# Patient Record
Sex: Female | Born: 1965 | Race: White | Hispanic: No | Marital: Married | Smoking: Never smoker
Health system: Southern US, Community
[De-identification: ages and names within clinical notes are randomized; demographics above are authoritative.]

## PROBLEM LIST (undated history)

## (undated) DIAGNOSIS — R112 Nausea with vomiting, unspecified: Secondary | ICD-10-CM

## (undated) DIAGNOSIS — T4145XA Adverse effect of unspecified anesthetic, initial encounter: Secondary | ICD-10-CM

## (undated) DIAGNOSIS — Z9889 Other specified postprocedural states: Secondary | ICD-10-CM

## (undated) DIAGNOSIS — T8859XA Other complications of anesthesia, initial encounter: Secondary | ICD-10-CM

## (undated) DIAGNOSIS — H35349 Macular cyst, hole, or pseudohole, unspecified eye: Secondary | ICD-10-CM

## (undated) DIAGNOSIS — H409 Unspecified glaucoma: Secondary | ICD-10-CM

## (undated) DIAGNOSIS — I341 Nonrheumatic mitral (valve) prolapse: Secondary | ICD-10-CM

---

## 1999-04-09 ENCOUNTER — Other Ambulatory Visit: Admission: RE | Admit: 1999-04-09 | Discharge: 1999-04-09 | Payer: Self-pay | Admitting: Obstetrics and Gynecology

## 2000-06-05 ENCOUNTER — Other Ambulatory Visit: Admission: RE | Admit: 2000-06-05 | Discharge: 2000-06-05 | Payer: Self-pay | Admitting: Obstetrics and Gynecology

## 2001-06-09 ENCOUNTER — Other Ambulatory Visit: Admission: RE | Admit: 2001-06-09 | Discharge: 2001-06-09 | Payer: Self-pay | Admitting: Obstetrics and Gynecology

## 2001-09-30 HISTORY — PX: FINGER SURGERY: SHX640

## 2002-06-22 ENCOUNTER — Other Ambulatory Visit: Admission: RE | Admit: 2002-06-22 | Discharge: 2002-06-22 | Payer: Self-pay | Admitting: Obstetrics and Gynecology

## 2003-06-27 ENCOUNTER — Other Ambulatory Visit: Admission: RE | Admit: 2003-06-27 | Discharge: 2003-06-27 | Payer: Self-pay | Admitting: Obstetrics and Gynecology

## 2003-10-01 HISTORY — PX: EYE SURGERY: SHX253

## 2003-11-25 ENCOUNTER — Ambulatory Visit (HOSPITAL_COMMUNITY): Admission: AD | Admit: 2003-11-25 | Discharge: 2003-11-26 | Payer: Self-pay | Admitting: Ophthalmology

## 2004-09-30 HISTORY — PX: VARICOSE VEIN SURGERY: SHX832

## 2004-11-29 ENCOUNTER — Ambulatory Visit (HOSPITAL_COMMUNITY): Admission: RE | Admit: 2004-11-29 | Discharge: 2004-11-29 | Payer: Self-pay | Admitting: Obstetrics and Gynecology

## 2005-12-17 ENCOUNTER — Ambulatory Visit (HOSPITAL_COMMUNITY): Admission: RE | Admit: 2005-12-17 | Discharge: 2005-12-17 | Payer: Self-pay | Admitting: Obstetrics and Gynecology

## 2006-12-19 ENCOUNTER — Ambulatory Visit (HOSPITAL_COMMUNITY): Admission: RE | Admit: 2006-12-19 | Discharge: 2006-12-19 | Payer: Self-pay | Admitting: Obstetrics and Gynecology

## 2007-01-06 ENCOUNTER — Encounter: Admission: RE | Admit: 2007-01-06 | Discharge: 2007-01-06 | Payer: Self-pay | Admitting: Obstetrics and Gynecology

## 2007-12-28 ENCOUNTER — Ambulatory Visit (HOSPITAL_COMMUNITY): Admission: RE | Admit: 2007-12-28 | Discharge: 2007-12-28 | Payer: Self-pay | Admitting: Obstetrics and Gynecology

## 2008-08-10 ENCOUNTER — Ambulatory Visit: Payer: Self-pay | Admitting: Vascular Surgery

## 2008-10-28 ENCOUNTER — Other Ambulatory Visit: Admission: RE | Admit: 2008-10-28 | Discharge: 2008-10-28 | Payer: Self-pay | Admitting: Family Medicine

## 2008-11-11 ENCOUNTER — Ambulatory Visit: Payer: Self-pay | Admitting: Vascular Surgery

## 2008-12-14 ENCOUNTER — Ambulatory Visit: Payer: Self-pay | Admitting: Vascular Surgery

## 2008-12-29 ENCOUNTER — Ambulatory Visit (HOSPITAL_COMMUNITY): Admission: RE | Admit: 2008-12-29 | Discharge: 2008-12-29 | Payer: Self-pay | Admitting: Family Medicine

## 2009-01-18 ENCOUNTER — Ambulatory Visit: Payer: Self-pay | Admitting: Vascular Surgery

## 2009-01-25 ENCOUNTER — Ambulatory Visit: Payer: Self-pay | Admitting: Vascular Surgery

## 2009-03-08 ENCOUNTER — Ambulatory Visit: Payer: Self-pay | Admitting: Vascular Surgery

## 2009-09-20 ENCOUNTER — Ambulatory Visit: Payer: Self-pay | Admitting: Vascular Surgery

## 2010-01-01 ENCOUNTER — Ambulatory Visit (HOSPITAL_COMMUNITY): Admission: RE | Admit: 2010-01-01 | Discharge: 2010-01-01 | Payer: Self-pay | Admitting: Family Medicine

## 2010-10-21 ENCOUNTER — Encounter: Payer: Self-pay | Admitting: Obstetrics and Gynecology

## 2010-12-03 ENCOUNTER — Other Ambulatory Visit (HOSPITAL_COMMUNITY): Payer: Self-pay | Admitting: Family Medicine

## 2010-12-03 DIAGNOSIS — Z1231 Encounter for screening mammogram for malignant neoplasm of breast: Secondary | ICD-10-CM

## 2011-01-07 ENCOUNTER — Ambulatory Visit (HOSPITAL_COMMUNITY)
Admission: RE | Admit: 2011-01-07 | Discharge: 2011-01-07 | Disposition: A | Discharge status: Home / Self Care | Payer: Managed Care, Other (non HMO) | Source: Ambulatory Visit | Attending: Family Medicine | Admitting: Family Medicine

## 2011-01-07 DIAGNOSIS — Z1231 Encounter for screening mammogram for malignant neoplasm of breast: Secondary | ICD-10-CM | POA: Insufficient documentation

## 2011-02-12 NOTE — Assessment & Plan Note (Signed)
OFFICE VISIT   Michelle Zamora, Michelle Zamora  DOB:  1966-09-18                                       03/08/2009  NWGNF#:[id number]   Patient presents today for followup of her bilateral vein treatment.  She had stab phlebectomies of her extensive varicosities over her left  anterior thigh and laser ablation and stab phlebectomies of her right  leg, greater saphenous vein and tributaries.  She is quite pleased with  her results.  She reports that she has had complete resolution of the  pain and discomfort and her calves with prolonged standing and with her  menstrual period.  She was having good results with continued healing of  the areas of stab phlebectomy and resolution of her bruising.   She is quite pleased with her result, as am I.  She will see [place] again on  an as-needed basis.   Larina Earthly, M.D.  Electronically Signed   TFE/MEDQ  D:  03/08/2009  T:  03/08/2009  Job:  [id number]   cc:   [name], M.D.

## 2011-02-12 NOTE — Assessment & Plan Note (Signed)
OFFICE VISIT   Campion, Lerin A  DOB:  1965/12/13                                       01/25/2009  ZOXWR#:[id number]   Patient presents today for a 1-week followup of her right anterior  branch of her saphenous vein ablation and stab phlebectomy in multiple  tributaries through her thigh.  She has the usual amount of bruising and  mild soreness.   She underwent a duplex today, and this shows ablation of the anterior  branch of her saphenous vein.  The more medial, proper saphenous vein  itself remains patent and competent.   I am pleased with her initial result, as is the patient.  Plan to see  her again in 6 weeks for final followup.   Larina Earthly, M.D.  Electronically Signed   TFE/MEDQ  D:  01/25/2009  T:  01/26/2009  Job:  [id number]   cc:   Dr. Carlyon Shadow

## 2011-02-12 NOTE — Assessment & Plan Note (Signed)
OFFICE VISIT   Zamora, Michelle A  DOB:  02-Sep-1966                                       11/11/2008  ZOXWR#:[id number]   Patient presents today for followup of her bilateral venous  hypertension.  I had seen her initially for consultation on 08/10/08.  Patient is an otherwise healthy 45 year old with progressive changes of  venous hypertension bilaterally.  She has been very compliant with her  compression garments but reports that she has had no improvement.  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  She has had decreased frequency  in duration of her exercise program secondary to pain and swelling and  also reports this makes housework and yard work difficult with squatting  position and prolonged standing due to the pain and swelling as well.   I reviewed her duplex with her.  She does have a very large varix  extending from her left groin over her anterior thigh to her lateral  knee and down onto her calf.  Duplex imaging of this area reveals that  this does arise from the saphenofemoral junction and does not arise from  the saphenous vein itself.  I have recommended that she be treated with  stab phlebectomy on the left leg.   On the right leg, she has varicosities in her calf and also her medial  thigh, and these do arise from a refluxing greater saphenous vein.  I  have recommended that she have right leg laser ablation and stab  phlebectomy.  She reports that the left leg is bothering her more than  the right, so we would recommend staged procedure, dressing the left leg  first.  I discussed the procedure under local anesthesia in our office  with an approximately 1-1/2 hours duration each.  I did explain  potential complications with her as well.  She understands, and we will  proceed with the procedure  at her convenience in the next several weeks.   Larina Earthly, M.D.  Electronically Signed   TFE/MEDQ  D:  11/11/2008  T:  11/14/2008  Job:  [id number]   cc:   [name], M.D.

## 2011-02-12 NOTE — Procedures (Signed)
LOWER EXTREMITY VENOUS REFLUX EXAM   INDICATION:  Bilateral lower extremity varicose veins.   EXAM:  Using color-flow imaging and pulse Doppler spectral analysis, the  right and left common femoral, superficial femoral, popliteal, posterior  tibial, greater and lesser saphenous veins are evaluated.  There is no  evidence suggesting deep venous insufficiency in the right or left lower  extremity.   The right and left saphenofemoral junctions are not competent.  The  right and left GSV's are not competent with the caliber as described  below.   The right and left proximal short saphenous veins demonstrate  competency.   GSV Diameter (used if found to be incompetent only)                                            Right    Left  Proximal Greater Saphenous Vein           0.5 cm   0.5 cm  Proximal-to-mid-thigh                     0.3 cm   0.4 cm  Mid thigh                                 0.3 cm   0.4 cm  Mid-distal thigh                          0.4 cm   0.4 cm  Distal thigh                              0.6 cm   0.2 cm  Knee                                      0.5 cm   0.2 cm   IMPRESSION:  1. Right greater saphenous vein reflux is identified with the caliber      ranging from 0.3 cm to 0.6 cm from the knee to the groin.  On the      left, greater saphenous vein diameter measurements range from 0.2      cm to 0.5 cm from the knee to the groin.  2. The right and left greater saphenous veins are not aneurysmal.  3. The left greater saphenous vein is tortuous.  4. The deep venous system is competent.  5. The right and left lesser saphenous veins are competent.  6. The right anterior branch of the greater saphenous vein is      incompetent.  This branch bifurcates and communicates with the      medial branch of the mid thigh.  The remaining greater saphenous      vein is incompetent.  7. Although the left greater saphenous vein is incompetent, the      largest  varicosities originate at the saphenofemoral junction from      a common origin with the greater saphenous vein.      ___________________________________________  Larina Earthly, M.D.   MC/MEDQ  D:  08/10/2008  T:  08/10/2008  Job:  [id number]

## 2011-02-12 NOTE — Procedures (Signed)
DUPLEX DEEP VENOUS EXAM - LOWER EXTREMITY   INDICATION:  Followup evaluation status post right greater saphenous  vein laser ablation.   HISTORY:  Edema:  No.  Trauma/Surgery:  Right greater saphenous vein laser ablation on  01/18/2009.  Left varicose vein treatment by stab phlebectomy 03/17.  Pain:  Right leg pain.  PE:  No.  Previous DVT:  No.  Anticoagulants:  No.  Other:   DUPLEX EXAM:                CFV   SFV   PopV  PTV    GSV                R  L  R  L  R  L  R   L  R  L  Thrombosis    0  0  0     0     0      P  0  Spontaneous   +  +  +     +     +      +  +  Phasic        +  +  +     +     +      +  +  Augmentation  +  +  +     +     +      +  +  Compressible  +  +  +     +     +      P  +  Competent     +  +  +     +     +      P  +   Legend:  + - yes  o - no  p - partial  D - decreased   IMPRESSION:  1. No evidence of right leg DVT.  2. One of two proximal thigh branches of the right greater saphenous      vein is thrombosed.  The remaining right greater saphenous vein is      patent and competent to the level of the top of the patella where      an anterior branch to the lateral side of the leg allows reflux      with calf augmentation and release.  3. On the left the previously documented varicose veins originating      from the saphenofemoral junction appear to be thrombosed.    _____________________________  Larina Earthly, M.D.   MC/MEDQ  D:  01/25/2009  T:  01/25/2009  Job:  [id number]

## 2011-02-12 NOTE — Assessment & Plan Note (Signed)
OFFICE VISIT   Zamora, Michelle A  DOB:  02-19-1966                                       12/14/2008  BJYNW#:[id number]   Patient presents today for treatment of large extensive varicosities  throughout her anterior left thigh, left lateral knee and calf.   She had a prior duplex evaluation showing these arose from the  saphenofemoral junction with no evidence of incompetence of her  saphenous vein.   Under tumescent anesthesia, she had multiple stab phlebectomies with no  immediate complication.   She was instructed on postoperative care, and I will see her in several  weeks for followup.   Larina Earthly, M.D.  Electronically Signed   TFE/MEDQ  D:  12/14/2008  T:  12/15/2008  Job:  [id number]

## 2011-02-12 NOTE — Consult Note (Signed)
NEW PATIENT CONSULTATION   Zamora, Michelle A  DOB:  October 27, 1965                                       08/10/2008  CHART#:[id number]   The patient presents today for evaluation of progressive pain related to  venous varicosities.  She is a healthy pleasant 45 year old white female  with progressively severe venous varicosities, left leg greater than her  right.  She reports these have been present for a number of years and is  most painful around the time of her menses.  She works as a Runner, broadcasting/film/video and  has pain associated with prolonged standing with these.  She has pain  specifically over the varicosities in both legs and also swelling and  pain with prolonged standing over her distal ankles.  Her history is  otherwise unremarkable other than mitral valve prolapse.  She has no  history of hypertension, diabetes or cardiac disease.   FAMILY HISTORY:  Significant for myocardial infarction in her mother at  age 9.   SOCIAL HISTORY:  She is married with 2 children.  She works as a  Runner, broadcasting/film/video.  She does not smoke or drink alcohol.   REVIEW OF SYSTEMS:  Her weight is reported at 155 pounds.  She is 5 feet  8 inches tall.  Review of systems otherwise completely negative from a  pulmonary, GI, GU or vascular standpoint.   PHYSICAL EXAMINATION:  General:  A well-developed, well-nourished white  female appearing her stated age of 10.  Vital signs:  Blood pressure  98/62, heart rate is 71.  Her dorsalis pedis pulses are 2+ bilaterally.  Her left leg is noted for a very large varix extending from her groin  over her anterior thigh to her lateral knee and down on to her lateral  calf.  She also has tributary varicosities on her medial calf and at her  distal ankle.  On her left leg she also has varicosities throughout her,  to a lesser degree of her anterior thigh and also on her medial thigh  and calf.   She underwent a formal venous duplex in our office and this reveals  some  incompetence in an anterior branch on the right saphenous vein from her  calf where she has the varicosities up to the thigh.  On the left leg  her main issue is large tributary varix extending from the  saphenofemoral junction laterally over her thigh.  The saphenous vein  itself has minimal disease on the left.   I discussed the significance of this with the patient.  I have  recommended that we begin initially with graduated compression garments.  She had one of these at the time of her pregnancy.  She also takes  ibuprofen for discomfort and elevates her legs when possible.  We will  see her again in 3 months for continued followup.  I did discuss  potential treatment with stab phlebectomy at the left leg varicosities  and the potential laser ablation of her anterior saphenous vein on the  right leg for relief of symptoms.  We will see her back in 3 months for  further discussion and I have fitted her today with thigh high 20-30  mmHg compression garments.   Larina Earthly, M.D.  Electronically Signed   TFE/MEDQ  D:  08/10/2008  T:  08/11/2008  Job:  [id number]  cc:   Cordelia Pen A. Rosalio Macadamia, M.D.  [name], M.D.

## 2011-02-15 NOTE — Op Note (Signed)
NAME:  SERRIA, SLOMA                     ACCOUNT NO.:  [id number]   MEDICAL RECORD NO.:  [id number]                   PATIENT TYPE:  OIB   LOCATION:  2860                                 FACILITY:  [facility]   PHYSICIAN:  Beulah Gandy. Ashley Royalty, M.D.              DATE OF BIRTH:  [birth year]   DATE OF PROCEDURE:  11/25/2003  DATE OF DISCHARGE:                                 OPERATIVE REPORT   ADMISSION DIAGNOSIS:  Rhegmatogenous retinal detachment, left eye.   PROCEDURE:  Scleral buckle, left eye.  Retinal photocoagulation, left eye.   SURGEON:  Beulah Gandy. Ashley Royalty, M.D.   ASSISTANT:  Merian Capron, M.A.   ANESTHESIA:  General.   DETAILS:  Usual prep and drape, 360-degree limbal peritomy, isolation of  four rectus muscles on 2-0 silk, localization of break at 9 o'clock, scleral  dissection for 360 degrees.  The bed was wider on the upper nasal and  narrower on the upper and lower temporal sides.  The 279 implant was placed  around the globe.  Diathermy was placed in the bed.  A 240 band was placed  around the eye with a 270 sleeve at 7 o'clock.  Perforation site chosen in  the anterior aspect of the bed at 10 o'clock.  A moderate amount of clear,  colorless subretinal fluid came forth.  A 508-G radial segment was placed  beneath the horseshoe tear.  Indirect ophthalmoscopy showed the retina to be  lying nicely in place with the break well-supported on the scleral buckle.  The indirect ophthalmoscope laser was moved into place.  613 burns were  placed around the retinal periphery and around the retinal break.  The power  was 600 mW, 1000 microns each, and 0.1 seconds each. The buckle was adjusted  and trimmed.  The band was adjusted and trimmed.  The sutures were knotted  and the free ends removed.  Eight total sutures were placed.  The  conjunctiva was reposited with 7.0 chromic suture.  Paracentesis x1 resulted  in a closing tension of 10 with a Barraquer tonometer.  Polymyxin and  Gentamicin were irrigated into Tenon's space.  Atropine solution was  applied.  Decadron 10 mg was injected into the lower subconjunctival space.  Marcaine was injected around the globe for postop pain.  Closing tension was  10 with a Barraquer tonometer.   COMPLICATIONS:  None.   DURATION:  Two hours.   Polysporin, a patch, and shield were placed.  The patient was awakened and  taken to recovery in a satisfactory condition.                                               Beulah Gandy. Ashley Royalty, M.D.    JDM/MEDQ  D:  11/25/2003  T:  11/26/2003  Job:  [id number]

## 2011-11-20 ENCOUNTER — Ambulatory Visit (INDEPENDENT_AMBULATORY_CARE_PROVIDER_SITE_OTHER): Payer: Managed Care, Other (non HMO) | Admitting: Ophthalmology

## 2011-11-20 DIAGNOSIS — H33009 Unspecified retinal detachment with retinal break, unspecified eye: Secondary | ICD-10-CM

## 2011-11-20 DIAGNOSIS — H25049 Posterior subcapsular polar age-related cataract, unspecified eye: Secondary | ICD-10-CM

## 2011-11-20 DIAGNOSIS — H33309 Unspecified retinal break, unspecified eye: Secondary | ICD-10-CM

## 2011-11-20 DIAGNOSIS — H43819 Vitreous degeneration, unspecified eye: Secondary | ICD-10-CM

## 2011-12-02 ENCOUNTER — Other Ambulatory Visit (HOSPITAL_COMMUNITY): Payer: Self-pay | Admitting: Family Medicine

## 2011-12-02 DIAGNOSIS — Z1231 Encounter for screening mammogram for malignant neoplasm of breast: Secondary | ICD-10-CM

## 2012-01-17 ENCOUNTER — Ambulatory Visit (HOSPITAL_COMMUNITY)
Admission: RE | Admit: 2012-01-17 | Discharge: 2012-01-17 | Disposition: A | Discharge status: Home / Self Care | Payer: Managed Care, Other (non HMO) | Source: Ambulatory Visit | Attending: Family Medicine | Admitting: Family Medicine

## 2012-01-17 DIAGNOSIS — Z1231 Encounter for screening mammogram for malignant neoplasm of breast: Secondary | ICD-10-CM

## 2012-11-06 ENCOUNTER — Encounter (INDEPENDENT_AMBULATORY_CARE_PROVIDER_SITE_OTHER): Payer: Managed Care, Other (non HMO) | Admitting: Ophthalmology

## 2012-11-06 DIAGNOSIS — H2510 Age-related nuclear cataract, unspecified eye: Secondary | ICD-10-CM

## 2012-11-06 DIAGNOSIS — H35349 Macular cyst, hole, or pseudohole, unspecified eye: Secondary | ICD-10-CM

## 2012-11-06 DIAGNOSIS — H33009 Unspecified retinal detachment with retinal break, unspecified eye: Secondary | ICD-10-CM

## 2012-11-06 DIAGNOSIS — H33309 Unspecified retinal break, unspecified eye: Secondary | ICD-10-CM

## 2012-11-06 DIAGNOSIS — H43819 Vitreous degeneration, unspecified eye: Secondary | ICD-10-CM

## 2012-11-09 NOTE — H&P (Signed)
Michelle Zamora is an 47 y.o. female.   Chief Complaint:rapid loss of vision left eye HPI: hx of retinal detachment repair  Now has macular hole  No past medical history on file.  No past surgical history on file.  No family history on file. Social History:  has no tobacco, alcohol, and drug history on file.  Allergies: Allergies not on file  No prescriptions prior to admission    Review of systems otherwise negative  There were no vitals taken for this visit.  Physical exam: Mental status: oriented x3. Eyes: See eye exam associated with this date of surgery in media tab.  Scanned in by scanning center Ears, Nose, Throat: within normal limits Neck: Within Normal limits General: within normal limits Chest: Within normal limits Breast: deferred Heart: Within normal limits Abdomen: Within normal limits GU: deferred Extremities: within normal limits Skin: within normal limits  Assessment/Plan Macular hole left eye Plan: To [facility] for Pars plana vitrectomy, membrane peel, serum patch, laser treatment, gas injection.  Sherrie George 11/09/2012, 4:28 PM

## 2012-11-11 ENCOUNTER — Encounter (HOSPITAL_COMMUNITY): Payer: Self-pay | Admitting: Pharmacy Technician

## 2012-11-23 ENCOUNTER — Ambulatory Visit (INDEPENDENT_AMBULATORY_CARE_PROVIDER_SITE_OTHER): Payer: Managed Care, Other (non HMO) | Admitting: Ophthalmology

## 2012-11-23 ENCOUNTER — Encounter (HOSPITAL_COMMUNITY): Payer: Self-pay | Admitting: *Deleted

## 2012-11-23 MED ORDER — CEFAZOLIN SODIUM-DEXTROSE 2-3 GM-% IV SOLR
2.0000 g | INTRAVENOUS | Status: AC
  Administered 2012-11-24: 2 g via INTRAVENOUS
  Filled 2012-11-23: qty 50

## 2012-11-24 ENCOUNTER — Ambulatory Visit (HOSPITAL_COMMUNITY): Payer: Managed Care, Other (non HMO) | Admitting: Certified Registered Nurse Anesthetist

## 2012-11-24 ENCOUNTER — Encounter (HOSPITAL_COMMUNITY): Payer: Self-pay | Admitting: Certified Registered Nurse Anesthetist

## 2012-11-24 ENCOUNTER — Ambulatory Visit (HOSPITAL_COMMUNITY)
Admission: RE | Admit: 2012-11-24 | Discharge: 2012-11-25 | Disposition: A | Discharge status: Home / Self Care | Payer: Managed Care, Other (non HMO) | Source: Ambulatory Visit | Attending: Ophthalmology | Admitting: Ophthalmology

## 2012-11-24 ENCOUNTER — Ambulatory Visit (HOSPITAL_COMMUNITY): Payer: Managed Care, Other (non HMO)

## 2012-11-24 ENCOUNTER — Encounter (HOSPITAL_COMMUNITY)
Admission: RE | Disposition: A | Discharge status: Home / Self Care | Payer: Self-pay | Source: Ambulatory Visit | Attending: Ophthalmology

## 2012-11-24 DIAGNOSIS — H35342 Macular cyst, hole, or pseudohole, left eye: Secondary | ICD-10-CM

## 2012-11-24 DIAGNOSIS — Z01818 Encounter for other preprocedural examination: Secondary | ICD-10-CM | POA: Insufficient documentation

## 2012-11-24 DIAGNOSIS — H35349 Macular cyst, hole, or pseudohole, unspecified eye: Secondary | ICD-10-CM

## 2012-11-24 DIAGNOSIS — Z01812 Encounter for preprocedural laboratory examination: Secondary | ICD-10-CM | POA: Insufficient documentation

## 2012-11-24 HISTORY — PX: SERUM PATCH: SHX6091

## 2012-11-24 HISTORY — PX: OTHER SURGICAL HISTORY: SHX169

## 2012-11-24 HISTORY — DX: Nonrheumatic mitral (valve) prolapse: I34.1

## 2012-11-24 HISTORY — DX: Other complications of anesthesia, initial encounter: T88.59XA

## 2012-11-24 HISTORY — PX: 25 GAUGE PARS PLANA VITRECTOMY WITH 20 GAUGE MVR PORT FOR MACULAR HOLE: SHX6096

## 2012-11-24 HISTORY — DX: Nausea with vomiting, unspecified: R11.2

## 2012-11-24 HISTORY — PX: MEMBRANE PEEL: SHX5967

## 2012-11-24 HISTORY — DX: Macular cyst, hole, or pseudohole, unspecified eye: H35.349

## 2012-11-24 HISTORY — PX: GAS INSERTION: SHX5336

## 2012-11-24 HISTORY — DX: Adverse effect of unspecified anesthetic, initial encounter: T41.45XA

## 2012-11-24 HISTORY — PX: PHOTOCOAGULATION WITH LASER: SHX6027

## 2012-11-24 HISTORY — DX: Other specified postprocedural states: Z98.890

## 2012-11-24 LAB — SURGICAL PCR SCREEN
MRSA, PCR: NEGATIVE
Staphylococcus aureus: NEGATIVE

## 2012-11-24 LAB — CBC
HCT: 40.9 % (ref 36.0–46.0)
Hemoglobin: 14.8 g/dL (ref 12.0–15.0)
MCH: 33.3 pg (ref 26.0–34.0)
MCHC: 36.2 g/dL — ABNORMAL HIGH (ref 30.0–36.0)
MCV: 92.1 fL (ref 78.0–100.0)
Platelets: 168 10*3/uL (ref 150–400)
RBC: 4.44 MIL/uL (ref 3.87–5.11)
RDW: 12.1 % (ref 11.5–15.5)
WBC: 5.1 10*3/uL (ref 4.0–10.5)

## 2012-11-24 LAB — HCG, SERUM, QUALITATIVE: Preg, Serum: NEGATIVE

## 2012-11-24 LAB — AUTOLOGOUS SERUM PATCH PREP

## 2012-11-24 SURGERY — 25 GAUGE PARS PLANA VITRECTOMY WITH 20 GAUGE MVR PORT FOR MACULAR HOLE
Anesthesia: General | Site: Eye | Laterality: Left | Wound class: Clean

## 2012-11-24 MED ORDER — MAGNESIUM HYDROXIDE 400 MG/5ML PO SUSP: 15.0000 mL | Freq: Four times a day (QID) | ORAL | Status: DC | PRN

## 2012-11-24 MED ORDER — BACITRACIN-POLYMYXIN B 500-10000 UNIT/GM OP OINT
TOPICAL_OINTMENT | OPHTHALMIC | Status: DC | PRN
  Administered 2012-11-24: 1 via OPHTHALMIC

## 2012-11-24 MED ORDER — OXYCODONE HCL 5 MG PO TABS: 5.0000 mg | ORAL_TABLET | Freq: Once | ORAL | Status: DC | PRN

## 2012-11-24 MED ORDER — OXYCODONE HCL 5 MG/5ML PO SOLN: 5.0000 mg | Freq: Once | ORAL | Status: DC | PRN

## 2012-11-24 MED ORDER — ONDANSETRON HCL 4 MG/2ML IJ SOLN: 4.0000 mg | Freq: Four times a day (QID) | INTRAMUSCULAR | Status: DC | PRN

## 2012-11-24 MED ORDER — TEMAZEPAM 15 MG PO CAPS: 15.0000 mg | ORAL_CAPSULE | Freq: Every evening | ORAL | Status: DC | PRN

## 2012-11-24 MED ORDER — MIDAZOLAM HCL 5 MG/5ML IJ SOLN
INTRAMUSCULAR | Status: DC | PRN
  Administered 2012-11-24: 2 mg via INTRAVENOUS

## 2012-11-24 MED ORDER — TROPICAMIDE 1 % OP SOLN
1.0000 [drp] | OPHTHALMIC | Status: AC | PRN
  Administered 2012-11-24: 1 [drp] via OPHTHALMIC
  Filled 2012-11-24: qty 3

## 2012-11-24 MED ORDER — FENTANYL CITRATE 0.05 MG/ML IJ SOLN
25.0000 ug | INTRAMUSCULAR | Status: DC | PRN
  Administered 2012-11-24: 50 ug via INTRAVENOUS

## 2012-11-24 MED ORDER — MORPHINE SULFATE 2 MG/ML IJ SOLN: 1.0000 mg | INTRAMUSCULAR | Status: DC | PRN

## 2012-11-24 MED ORDER — SCOPOLAMINE 1 MG/3DAYS TD PT72: 1.0000 | MEDICATED_PATCH | TRANSDERMAL | Status: DC

## 2012-11-24 MED ORDER — PROMETHAZINE HCL 25 MG/ML IJ SOLN: 6.2500 mg | INTRAMUSCULAR | Status: DC | PRN

## 2012-11-24 MED ORDER — SCOPOLAMINE 1 MG/3DAYS TD PT72
MEDICATED_PATCH | TRANSDERMAL | Status: AC
  Administered 2012-11-24: 1.5 mg via TRANSDERMAL
  Filled 2012-11-24: qty 1

## 2012-11-24 MED ORDER — DOCUSATE SODIUM 100 MG PO CAPS
100.0000 mg | ORAL_CAPSULE | Freq: Two times a day (BID) | ORAL | Status: DC
  Administered 2012-11-24: 100 mg via ORAL
  Filled 2012-11-24: qty 1

## 2012-11-24 MED ORDER — HEMOSTATIC AGENTS (NO CHARGE) OPTIME
TOPICAL | Status: DC | PRN
  Administered 2012-11-24: 1

## 2012-11-24 MED ORDER — SODIUM CHLORIDE 0.9 % IV SOLN
INTRAVENOUS | Status: DC
  Administered 2012-11-24: 14:00:00 via INTRAVENOUS

## 2012-11-24 MED ORDER — CYCLOPENTOLATE HCL 1 % OP SOLN
1.0000 [drp] | OPHTHALMIC | Status: AC | PRN
  Administered 2012-11-24: 1 [drp] via OPHTHALMIC
  Filled 2012-11-24: qty 2

## 2012-11-24 MED ORDER — BSS IO SOLN
INTRAOCULAR | Status: AC
  Filled 2012-11-24: qty 15

## 2012-11-24 MED ORDER — SODIUM HYALURONATE 10 MG/ML IO SOLN
INTRAOCULAR | Status: AC
  Filled 2012-11-24: qty 0.85

## 2012-11-24 MED ORDER — POLYMYXIN B SULFATE 500000 UNITS IJ SOLR
INTRAMUSCULAR | Status: AC
  Filled 2012-11-24: qty 1

## 2012-11-24 MED ORDER — FENTANYL CITRATE 0.05 MG/ML IJ SOLN
INTRAMUSCULAR | Status: AC
  Filled 2012-11-24: qty 2

## 2012-11-24 MED ORDER — DEXAMETHASONE SODIUM PHOSPHATE 10 MG/ML IJ SOLN
INTRAMUSCULAR | Status: DC | PRN
  Administered 2012-11-24: 10 mg

## 2012-11-24 MED ORDER — BSS IO SOLN
INTRAOCULAR | Status: DC | PRN
  Administered 2012-11-24: 15 mL via INTRAOCULAR

## 2012-11-24 MED ORDER — EPHEDRINE SULFATE 50 MG/ML IJ SOLN
INTRAMUSCULAR | Status: DC | PRN
  Administered 2012-11-24: 5 mg via INTRAVENOUS

## 2012-11-24 MED ORDER — MIDAZOLAM HCL 2 MG/2ML IJ SOLN: 0.5000 mg | Freq: Once | INTRAMUSCULAR | Status: DC | PRN

## 2012-11-24 MED ORDER — ATROPINE SULFATE 1 % OP SOLN
OPHTHALMIC | Status: AC
  Filled 2012-11-24: qty 2

## 2012-11-24 MED ORDER — BUPIVACAINE HCL (PF) 0.75 % IJ SOLN
INTRAMUSCULAR | Status: AC
  Filled 2012-11-24: qty 10

## 2012-11-24 MED ORDER — BUPIVACAINE HCL (PF) 0.75 % IJ SOLN
INTRAMUSCULAR | Status: DC | PRN
  Administered 2012-11-24: 10 mL

## 2012-11-24 MED ORDER — PROVISC 10 MG/ML IO SOLN
INTRAOCULAR | Status: DC | PRN
  Administered 2012-11-24: .85 mL via INTRAOCULAR

## 2012-11-24 MED ORDER — BACITRACIN-POLYMYXIN B 500-10000 UNIT/GM OP OINT
TOPICAL_OINTMENT | OPHTHALMIC | Status: AC
  Filled 2012-11-24: qty 3.5

## 2012-11-24 MED ORDER — HYALURONIDASE HUMAN 150 UNIT/ML IJ SOLN
INTRAMUSCULAR | Status: AC
  Filled 2012-11-24: qty 1

## 2012-11-24 MED ORDER — PREDNISOLONE ACETATE 1 % OP SUSP
1.0000 [drp] | Freq: Four times a day (QID) | OPHTHALMIC | Status: DC
  Filled 2012-11-24: qty 1

## 2012-11-24 MED ORDER — ROCURONIUM BROMIDE 100 MG/10ML IV SOLN
INTRAVENOUS | Status: DC | PRN
  Administered 2012-11-24: 40 mg via INTRAVENOUS

## 2012-11-24 MED ORDER — DEXAMETHASONE SODIUM PHOSPHATE 10 MG/ML IJ SOLN
INTRAMUSCULAR | Status: AC
  Filled 2012-11-24: qty 1

## 2012-11-24 MED ORDER — GLYCOPYRROLATE 0.2 MG/ML IJ SOLN
INTRAMUSCULAR | Status: DC | PRN
  Administered 2012-11-24: 0.6 mg via INTRAVENOUS
  Administered 2012-11-24: 0.2 mg via INTRAVENOUS

## 2012-11-24 MED ORDER — BACITRACIN-POLYMYXIN B 500-10000 UNIT/GM OP OINT
1.0000 "application " | TOPICAL_OINTMENT | Freq: Four times a day (QID) | OPHTHALMIC | Status: DC
  Filled 2012-11-24: qty 3.5

## 2012-11-24 MED ORDER — MINERAL OIL LIGHT 100 % EX OIL
TOPICAL_OIL | CUTANEOUS | Status: AC
  Filled 2012-11-24: qty 25

## 2012-11-24 MED ORDER — ONDANSETRON HCL 4 MG/2ML IJ SOLN
INTRAMUSCULAR | Status: DC | PRN
  Administered 2012-11-24: 4 mg via INTRAVENOUS

## 2012-11-24 MED ORDER — LIDOCAINE HCL (CARDIAC) 20 MG/ML IV SOLN
INTRAVENOUS | Status: DC | PRN
  Administered 2012-11-24: 25 mg via INTRAVENOUS

## 2012-11-24 MED ORDER — HYPROMELLOSE (GONIOSCOPIC) 2.5 % OP SOLN
OPHTHALMIC | Status: AC
  Filled 2012-11-24: qty 15

## 2012-11-24 MED ORDER — ATROPINE SULFATE 1 % OP OINT
TOPICAL_OINTMENT | OPHTHALMIC | Status: DC | PRN
  Administered 2012-11-24: 1 via OPHTHALMIC

## 2012-11-24 MED ORDER — EPINEPHRINE HCL 1 MG/ML IJ SOLN
INTRAMUSCULAR | Status: AC
  Filled 2012-11-24: qty 1

## 2012-11-24 MED ORDER — GATIFLOXACIN 0.5 % OP SOLN
1.0000 [drp] | Freq: Four times a day (QID) | OPHTHALMIC | Status: DC
  Filled 2012-11-24: qty 2.5

## 2012-11-24 MED ORDER — LATANOPROST 0.005 % OP SOLN
1.0000 [drp] | Freq: Every day | OPHTHALMIC | Status: DC
  Filled 2012-11-24: qty 2.5

## 2012-11-24 MED ORDER — MEPERIDINE HCL 25 MG/ML IJ SOLN: 6.2500 mg | INTRAMUSCULAR | Status: DC | PRN

## 2012-11-24 MED ORDER — NEOSTIGMINE METHYLSULFATE 1 MG/ML IJ SOLN
INTRAMUSCULAR | Status: DC | PRN
  Administered 2012-11-24: 4 mg via INTRAVENOUS

## 2012-11-24 MED ORDER — LIDOCAINE HCL 2 % IJ SOLN
INTRAMUSCULAR | Status: AC
  Filled 2012-11-24: qty 20

## 2012-11-24 MED ORDER — SODIUM CHLORIDE 0.9 % IV SOLN
INTRAVENOUS | Status: DC | PRN
  Administered 2012-11-24: 14:00:00 via INTRAVENOUS

## 2012-11-24 MED ORDER — EPINEPHRINE HCL 1 MG/ML IJ SOLN
INTRAOCULAR | Status: DC | PRN
  Administered 2012-11-24: 14:00:00

## 2012-11-24 MED ORDER — ADULT MULTIVITAMIN W/MINERALS CH
1.0000 | ORAL_TABLET | Freq: Every day | ORAL | Status: DC
  Administered 2012-11-24: 1 via ORAL
  Filled 2012-11-24 (×2): qty 1

## 2012-11-24 MED ORDER — BSS PLUS IO SOLN
INTRAOCULAR | Status: AC
  Filled 2012-11-24: qty 500

## 2012-11-24 MED ORDER — FENTANYL CITRATE 0.05 MG/ML IJ SOLN
INTRAMUSCULAR | Status: DC | PRN
  Administered 2012-11-24: 150 ug via INTRAVENOUS

## 2012-11-24 MED ORDER — HYDROCODONE-ACETAMINOPHEN 5-325 MG PO TABS: 1.0000 | ORAL_TABLET | ORAL | Status: DC | PRN

## 2012-11-24 MED ORDER — TETRACAINE HCL 0.5 % OP SOLN
2.0000 [drp] | Freq: Once | OPHTHALMIC | Status: DC
  Filled 2012-11-24: qty 2

## 2012-11-24 MED ORDER — GATIFLOXACIN 0.5 % OP SOLN
1.0000 [drp] | OPHTHALMIC | Status: AC | PRN
  Administered 2012-11-24: 1 [drp] via OPHTHALMIC
  Filled 2012-11-24: qty 2.5

## 2012-11-24 MED ORDER — GENTAMICIN SULFATE 40 MG/ML IJ SOLN
INTRAMUSCULAR | Status: AC
  Filled 2012-11-24: qty 2

## 2012-11-24 MED ORDER — ACETAMINOPHEN 325 MG PO TABS: 325.0000 mg | ORAL_TABLET | ORAL | Status: DC | PRN

## 2012-11-24 MED ORDER — SODIUM CHLORIDE 0.45 % IV SOLN
INTRAVENOUS | Status: DC
  Administered 2012-11-24: 20 mL/h via INTRAVENOUS

## 2012-11-24 MED ORDER — MUPIROCIN 2 % EX OINT
TOPICAL_OINTMENT | CUTANEOUS | Status: AC
  Administered 2012-11-24: 1
  Filled 2012-11-24: qty 22

## 2012-11-24 MED ORDER — PROPOFOL 10 MG/ML IV BOLUS
INTRAVENOUS | Status: DC | PRN
  Administered 2012-11-24: 200 mg via INTRAVENOUS

## 2012-11-24 MED ORDER — LORATADINE 10 MG PO TABS
10.0000 mg | ORAL_TABLET | Freq: Every day | ORAL | Status: DC
  Administered 2012-11-24: 10 mg via ORAL
  Filled 2012-11-24 (×2): qty 1

## 2012-11-24 MED ORDER — BRIMONIDINE TARTRATE 0.2 % OP SOLN
1.0000 [drp] | Freq: Two times a day (BID) | OPHTHALMIC | Status: DC
  Filled 2012-11-24: qty 5

## 2012-11-24 MED ORDER — PHENYLEPHRINE HCL 2.5 % OP SOLN
1.0000 [drp] | OPHTHALMIC | Status: AC | PRN
  Administered 2012-11-24: 1 [drp] via OPHTHALMIC
  Filled 2012-11-24: qty 3

## 2012-11-24 MED ORDER — ACETAZOLAMIDE SODIUM 500 MG IJ SOLR
500.0000 mg | Freq: Once | INTRAMUSCULAR | Status: AC
  Administered 2012-11-25: 500 mg via INTRAVENOUS
  Filled 2012-11-24: qty 500

## 2012-11-24 MED ORDER — ACETAZOLAMIDE SODIUM 500 MG IJ SOLR
INTRAMUSCULAR | Status: AC
  Filled 2012-11-24: qty 500

## 2012-11-24 SURGICAL SUPPLY — 67 items
ACCESSORY FRAGMATOME (MISCELLANEOUS) IMPLANT
APL SRG 3 HI ABS STRL LF PLS (MISCELLANEOUS)
APPLICATOR DR MATTHEWS STRL (MISCELLANEOUS) IMPLANT
BALL CTTN LRG ABS STRL LF (GAUZE/BANDAGES/DRESSINGS) ×3
BLADE EYE CATARACT 19 1.4 BEAV (BLADE) IMPLANT
BLADE MVR KNIFE 19G (BLADE) IMPLANT
BLADE MVR KNIFE 20G (BLADE) ×2 IMPLANT
CANNULA ANT CHAM MAIN (OPHTHALMIC RELATED) IMPLANT
CANNULA FLEX TIP 25G (CANNULA) ×2 IMPLANT
CANNULA SUBRETINAL FLUID 20G (BLADE) ×2 IMPLANT
CLOTH BEACON ORANGE TIMEOUT ST (SAFETY) ×2 IMPLANT
CORDS BIPOLAR (ELECTRODE) ×1 IMPLANT
COTTONBALL LRG STERILE PKG (GAUZE/BANDAGES/DRESSINGS) ×6 IMPLANT
COVER MAYO STAND STRL (DRAPES) IMPLANT
DRAPE OPHTHALMIC 77X100 STRL (CUSTOM PROCEDURE TRAY) ×2 IMPLANT
EAGLE VIT/RET MICRO PIC 168 25 (MISCELLANEOUS) IMPLANT
ERASER HMR WETFIELD 23G BP (MISCELLANEOUS) IMPLANT
FILTER BLUE MILLIPORE (MISCELLANEOUS) ×4 IMPLANT
FILTER STRAW FLUID ASPIR (MISCELLANEOUS) ×2 IMPLANT
GAS OPHTHALMIC (MISCELLANEOUS) ×1 IMPLANT
GLOVE SS BIOGEL STRL SZ 6.5 (GLOVE) ×1 IMPLANT
GLOVE SS BIOGEL STRL SZ 7 (GLOVE) ×1 IMPLANT
GLOVE SUPERSENSE BIOGEL SZ 6.5 (GLOVE) ×1
GLOVE SUPERSENSE BIOGEL SZ 7 (GLOVE) ×1
GLOVE SURG 8.5 LATEX PF (GLOVE) ×2 IMPLANT
GOWN STRL NON-REIN LRG LVL3 (GOWN DISPOSABLE) ×6 IMPLANT
ILLUMINATOR CHOW PICK 25GA (MISCELLANEOUS) ×2 IMPLANT
KIT BASIN OR (CUSTOM PROCEDURE TRAY) ×2 IMPLANT
KIT ROOM TURNOVER OR (KITS) ×2 IMPLANT
KNIFE CRESCENT 1.75 EDGEAHEAD (BLADE) ×2 IMPLANT
KNIFE GRIESHABER SHARP 2.5MM (MISCELLANEOUS) IMPLANT
NDL 18GX1X1/2 (RX/OR ONLY) (NEEDLE) ×1 IMPLANT
NDL 25GX 5/8IN NON SAFETY (NEEDLE) ×1 IMPLANT
NDL HYPO 30X.5 LL (NEEDLE) ×2 IMPLANT
NEEDLE 18GX1X1/2 (RX/OR ONLY) (NEEDLE) ×2 IMPLANT
NEEDLE 25GX 5/8IN NON SAFETY (NEEDLE) ×2 IMPLANT
NEEDLE 27GAX1X1/2 (NEEDLE) IMPLANT
NEEDLE BACKFLUSH 1281 A1 (NEEDLE) ×1 IMPLANT
NEEDLE HYPO 30X.5 LL (NEEDLE) ×4 IMPLANT
NS IRRIG 1000ML POUR BTL (IV SOLUTION) ×2 IMPLANT
PACK VITRECTOMY CUSTOM (CUSTOM PROCEDURE TRAY) ×2 IMPLANT
PAD ARMBOARD 7.5X6 YLW CONV (MISCELLANEOUS) ×4 IMPLANT
PAK VITRECTOMY PIK 25 GA (OPHTHALMIC RELATED) IMPLANT
PROBE DIRECTIONAL LASER (MISCELLANEOUS) ×1 IMPLANT
REPL STRA BRUSH NDL (NEEDLE) ×1 IMPLANT
REPL STRA BRUSH NEEDLE (NEEDLE) ×4 IMPLANT
RESERVOIR BACK FLUSH (MISCELLANEOUS) ×2 IMPLANT
ROLLS DENTAL (MISCELLANEOUS) ×4 IMPLANT
SCRAPER DIAMOND 25GA (OPHTHALMIC RELATED) IMPLANT
SCRAPER DIAMOND DUST MEMBRANE (MISCELLANEOUS) ×2 IMPLANT
SPONGE SURGIFOAM ABS GEL 12-7 (HEMOSTASIS) ×2 IMPLANT
STOPCOCK 4 WAY LG BORE MALE ST (IV SETS) ×2 IMPLANT
SUT CHROMIC 7 0 TG140 8 (SUTURE) ×1 IMPLANT
SUT ETHILON 9 0 TG140 8 (SUTURE) ×2 IMPLANT
SUT POLY NON ABSORB 10-0 8 STR (SUTURE) IMPLANT
SUT SILK 4 0 RB 1 (SUTURE) IMPLANT
SYR 20CC LL (SYRINGE) ×2 IMPLANT
SYR 50ML LL SCALE MARK (SYRINGE) ×3 IMPLANT
SYR 5ML LL (SYRINGE) IMPLANT
SYR BULB 3OZ (MISCELLANEOUS) ×2 IMPLANT
SYR TB 1ML LUER SLIP (SYRINGE) ×2 IMPLANT
SYRINGE 10CC LL (SYRINGE) ×2 IMPLANT
TAPE SURG TRANSPORE 1 IN (GAUZE/BANDAGES/DRESSINGS) IMPLANT
TAPE SURGICAL TRANSPORE 1 IN (GAUZE/BANDAGES/DRESSINGS) ×1
TOWEL OR 17X24 6PK STRL BLUE (TOWEL DISPOSABLE) ×6 IMPLANT
WATER STERILE IRR 1000ML POUR (IV SOLUTION) ×2 IMPLANT
WIPE INSTRUMENT VISIWIPE 73X73 (MISCELLANEOUS) ×2 IMPLANT

## 2012-11-24 NOTE — H&P (Signed)
I examined the patient today and there is no change in the medical status 

## 2012-11-24 NOTE — Preoperative (Signed)
Beta Blockers   Reason not to administer Beta Blockers:Not Applicable 

## 2012-11-24 NOTE — Transfer of Care (Signed)
Immediate Anesthesia Transfer of Care Note  Patient: Michelle Zamora  Procedure(s) Performed: Procedure(s) with comments: 25 GAUGE PARS PLANA VITRECTOMY WITH 20 GAUGE MVR PORT FOR MACULAR HOLE (Left) MEMBRANE PEEL (Left) SERUM PATCH (Left) INSERTION OF GAS (Left) - C3F8 PHOTOCOAGULATION WITH LASER (Left) - ENDOLASER  Patient Location: PACU  Anesthesia Type:General  Level of Consciousness: awake and patient cooperative  Airway & Oxygen Therapy: Patient Spontanous Breathing and Patient connected to face mask oxygen  Post-op Assessment: Report given to PACU RN and Post -op Vital signs reviewed and stable  Post vital signs: Reviewed and stable  Complications: No apparent anesthesia complications

## 2012-11-24 NOTE — Anesthesia Preprocedure Evaluation (Signed)
Anesthesia Evaluation  Patient identified by MRN, date of birth, ID band Patient awake    Reviewed: Allergy & Precautions, H&P , NPO status , Patient's Chart, lab work & pertinent test results, reviewed documented beta blocker date and time   History of Anesthesia Complications (+) PONV  Airway Mallampati: I TM Distance: >3 FB Neck ROM: Full    Dental  (+) Teeth Intact and Dental Advisory Given   Pulmonary neg pulmonary ROS,  breath sounds clear to auscultation  Pulmonary exam normal       Cardiovascular negative cardio ROS  Rhythm:Regular Rate:Normal     Neuro/Psych negative neurological ROS     GI/Hepatic negative GI ROS, Neg liver ROS,   Endo/Other  negative endocrine ROS  Renal/GU negative Renal ROS     Musculoskeletal   Abdominal   Peds  Hematology negative hematology ROS (+)   Anesthesia Other Findings   Reproductive/Obstetrics                           Anesthesia Physical Anesthesia Plan  ASA: I  Anesthesia Plan: General   Post-op Pain Management:    Induction: Intravenous  Airway Management Planned: Oral ETT  Additional Equipment:   Intra-op Plan:   Post-operative Plan: Extubation in OR  Informed Consent: I have reviewed the patients History and Physical, chart, labs and discussed the procedure including the risks, benefits and alternatives for the proposed anesthesia with the patient or authorized representative who has indicated his/her understanding and acceptance.   Dental advisory given  Plan Discussed with: Surgeon and CRNA  Anesthesia Plan Comments: (Plan routine monitors, GETA)        Anesthesia Quick Evaluation

## 2012-11-24 NOTE — Progress Notes (Signed)
Pre-op drops given as ordered.

## 2012-11-24 NOTE — Brief Op Note (Signed)
Brief Operative note   Preoperative diagnosis:  Pre-Op Diagnosis Codes:    * Macular cyst, hole, or pseudohole of retina [362.54] Postoperative diagnosis  Post-Op Diagnosis Codes:    * Macular cyst, hole, or pseudohole of retina [362.54]  Procedures: Pars plana vitrectomy, laser, serum patch, membrane peel. Left eye  Surgeon:  Sherrie George, MD...  Assistant:  Rosalie Doctor SA    Anesthesia: General  Specimen: none  Estimated blood loss:  1cc  Complications: none  Patient sent to PACU in good condition  Composed by Sherrie George MD  Dictation number: [id number]

## 2012-11-25 MED ORDER — BACITRACIN-POLYMYXIN B 500-10000 UNIT/GM OP OINT: 1.0000 "application " | TOPICAL_OINTMENT | Freq: Four times a day (QID) | OPHTHALMIC | Status: DC

## 2012-11-25 MED ORDER — GATIFLOXACIN 0.5 % OP SOLN: 1.0000 [drp] | Freq: Four times a day (QID) | OPHTHALMIC | Status: DC

## 2012-11-25 NOTE — Discharge Summary (Signed)
Discharge summary not needed on [facility] patients per medical records. 

## 2012-11-25 NOTE — Discharge Summary (Signed)
At 0900, patient and patient's spouse given home-going instructions regarding care of eye and follow-up appointment. Patient denies pain at present, alert, and aware of plan of care. Patient discharged off unit. Wheelchair provided. Sherlyn Lees, RN

## 2012-11-25 NOTE — Progress Notes (Addendum)
11/25/2012, 6:50 AM  Mental Status:  Awake, Alert, Oriented  Anterior segment: Cornea  Clear    Anterior Chamber Clear    Lens:   Clear, IOL, Cataract  Intra Ocular Pressure 20= mmHg with Tonopen  Vitreous: Clear 90%gas bubble   Retina:  Attached Good laser reaction   Impression: Excellent result Retina attached   Final Diagnosis: Active Problems:  Macular hole.  Previous retinal detachment   Plan: start post operative eye drops.  Discharge to home.  Give post operative instructions  Sherrie George 11/25/2012, 6:50 AM

## 2012-11-25 NOTE — Op Note (Signed)
Michelle Zamora, Michelle Zamora           ACCOUNT NO.:  [id number]  MEDICAL RECORD NO.:  [id number]  LOCATION:  6N06C                        FACILITY:  [facility]  PHYSICIAN:  Beulah Gandy. Ashley Royalty, M.D. DATE OF BIRTH:  01-18-1966  DATE OF PROCEDURE:  11/24/2012 DATE OF DISCHARGE:                              OPERATIVE REPORT   ADMISSION PROCEDURE:  Macular hole, left eye.  Previous retinal detachment, left eye.  PROCEDURES:  Pars plana vitrectomy, retinal photocoagulation, gas fluid exchange, membrane peel, serum patch in the left eye.  SURGEON:  Beulah Gandy. Ashley Royalty, M.D.  ASSISTANT:  Rosalie Doctor, SA.  ANESTHESIA:  General.  DETAILS:  After usual prep and drape, 25-gauge trocar was placed at 10 o'clock and 4 o'clock.  Three-layered MVR incision made at 2 o'clock. Provisc placed on the corneal surface.  The contact lens ring anchored into place.  The flat contact lens was placed.  Pars plana vitrectomy was begun just behind the cataractous lens.  The was a large core of dense cataract material obscuring the central vision.  Vitrectomy was carried posteriorly and large amounts of membranes were encountered. These were carefully removed under low suction and rapid cutting.  The macular hole became visible and the silicone-tipped diamond-dusted membrane scraper was brought down to the macular surface.  The internal limiting membrane was peeled with the diamond-dusted membrane scraper for 360 degrees around the hole approximately 1 disk diameter in radius. Once this was accomplished, the hole was pushed together and the internal limiting membrane remnants were removed with the vitreous cutter.  The silicone tip suction line was brought into the vitreous cavity and there was no fish strike sign present.  The vitrectomy was carried into the mid periphery and far periphery with a 30-degree prismatic wide sapphire lens.  The lens was rotated and vitrectomy was carried out down to the end of the scleral  buckle in the periphery. Additional laser was placed at this point 549 burns with a power of 1500 mW 1000 microns each at 0.1 seconds each were placed around the previous retinal breaks.  The anterior flap of torn retina was removed with the vitreous cutter.  Once all the vitreous was removed, a gas fluid exchange was carried out.  Sufficient time was allowed for additional fluid to track down the walls of the eye and collect in the posterior segment.  The serum patch was prepared during this time and the perfluoropropane 16% concentration was prepared during this time.  The vitrectomy instruments re-entered the eye and additional fluid was vacuumed from the retinal surface with the [place] ophthalmics brush. Serum patch was delivered.  Additional fluid was removed again.  C3F8 16% was exchanged for intravitreal gas.  The instruments were removed from the eye.  9-0 nylon was used to close the single sclerotomy.  The conjunctiva was closed with wet-field cautery.  Polymyxin and gentamicin were irrigated into tenon space.  Atropine solution was applied. Marcaine was injected around the globe for postop pain.  Decadron 10 mg was injected into the lower subconjunctival space.  Closing pressure was 10 with a Risk manager.  Complications none.  Polysporin and patch and shield were placed.  The patient was awakened and taken to  recovery room in satisfactory condition.     Beulah Gandy. Ashley Royalty, M.D.     JDM/MEDQ  D:  11/24/2012  T:  11/25/2012  Job:  [id number]

## 2012-11-25 NOTE — Progress Notes (Signed)
Utilization Review Completed.   Sean Macwilliams, RN, BSN Nurse Case Manager  [phone]  

## 2012-11-26 ENCOUNTER — Encounter (HOSPITAL_COMMUNITY): Payer: Self-pay | Admitting: Ophthalmology

## 2012-11-26 NOTE — Anesthesia Postprocedure Evaluation (Signed)
  Anesthesia Post-op Note  Patient: Michelle Zamora  Procedure(s) Performed: Procedure(s) with comments: 25 GAUGE PARS PLANA VITRECTOMY WITH 20 GAUGE MVR PORT FOR MACULAR HOLE (Left) MEMBRANE PEEL (Left) SERUM PATCH (Left) INSERTION OF GAS (Left) - C3F8 PHOTOCOAGULATION WITH LASER (Left) - ENDOLASER  Patient Location: PACU  Anesthesia Type:General  Level of Consciousness: awake, alert , oriented and patient cooperative  Airway and Oxygen Therapy: Patient Spontanous Breathing  Post-op Pain: none  Post-op Assessment: Post-op Vital signs reviewed, Patient's Cardiovascular Status Stable, Respiratory Function Stable, Patent Airway, No signs of Nausea or vomiting and Pain level controlled  Post-op Vital Signs: Reviewed and stable  Complications: No apparent anesthesia complications

## 2012-11-27 ENCOUNTER — Ambulatory Visit (INDEPENDENT_AMBULATORY_CARE_PROVIDER_SITE_OTHER): Payer: Managed Care, Other (non HMO) | Admitting: Ophthalmology

## 2012-12-01 ENCOUNTER — Encounter (INDEPENDENT_AMBULATORY_CARE_PROVIDER_SITE_OTHER): Payer: Managed Care, Other (non HMO) | Admitting: Ophthalmology

## 2012-12-01 DIAGNOSIS — H35349 Macular cyst, hole, or pseudohole, unspecified eye: Secondary | ICD-10-CM

## 2012-12-16 ENCOUNTER — Other Ambulatory Visit (HOSPITAL_COMMUNITY): Payer: Self-pay | Admitting: Family Medicine

## 2012-12-16 DIAGNOSIS — Z1231 Encounter for screening mammogram for malignant neoplasm of breast: Secondary | ICD-10-CM

## 2012-12-22 ENCOUNTER — Encounter (INDEPENDENT_AMBULATORY_CARE_PROVIDER_SITE_OTHER): Payer: Managed Care, Other (non HMO) | Admitting: Ophthalmology

## 2012-12-22 DIAGNOSIS — H35349 Macular cyst, hole, or pseudohole, unspecified eye: Secondary | ICD-10-CM

## 2013-01-22 ENCOUNTER — Ambulatory Visit (HOSPITAL_COMMUNITY)
Admission: RE | Admit: 2013-01-22 | Discharge: 2013-01-22 | Disposition: A | Discharge status: Home / Self Care | Payer: Managed Care, Other (non HMO) | Source: Ambulatory Visit | Attending: Family Medicine | Admitting: Family Medicine

## 2013-01-22 DIAGNOSIS — Z1231 Encounter for screening mammogram for malignant neoplasm of breast: Secondary | ICD-10-CM

## 2013-03-05 ENCOUNTER — Encounter (INDEPENDENT_AMBULATORY_CARE_PROVIDER_SITE_OTHER): Payer: Managed Care, Other (non HMO) | Admitting: Ophthalmology

## 2013-03-05 DIAGNOSIS — H35349 Macular cyst, hole, or pseudohole, unspecified eye: Secondary | ICD-10-CM

## 2013-03-05 DIAGNOSIS — H33009 Unspecified retinal detachment with retinal break, unspecified eye: Secondary | ICD-10-CM

## 2013-03-05 DIAGNOSIS — H43819 Vitreous degeneration, unspecified eye: Secondary | ICD-10-CM

## 2013-03-05 DIAGNOSIS — H2510 Age-related nuclear cataract, unspecified eye: Secondary | ICD-10-CM

## 2013-09-10 ENCOUNTER — Ambulatory Visit (INDEPENDENT_AMBULATORY_CARE_PROVIDER_SITE_OTHER): Payer: Managed Care, Other (non HMO) | Admitting: Ophthalmology

## 2013-09-10 DIAGNOSIS — H33009 Unspecified retinal detachment with retinal break, unspecified eye: Secondary | ICD-10-CM

## 2013-09-10 DIAGNOSIS — H43819 Vitreous degeneration, unspecified eye: Secondary | ICD-10-CM

## 2013-09-10 DIAGNOSIS — H35349 Macular cyst, hole, or pseudohole, unspecified eye: Secondary | ICD-10-CM

## 2013-09-10 DIAGNOSIS — H2510 Age-related nuclear cataract, unspecified eye: Secondary | ICD-10-CM

## 2013-12-22 ENCOUNTER — Other Ambulatory Visit (HOSPITAL_COMMUNITY): Payer: Self-pay | Admitting: Family Medicine

## 2013-12-22 DIAGNOSIS — Z1231 Encounter for screening mammogram for malignant neoplasm of breast: Secondary | ICD-10-CM

## 2014-01-26 ENCOUNTER — Ambulatory Visit (HOSPITAL_COMMUNITY): Payer: Managed Care, Other (non HMO)

## 2014-01-28 ENCOUNTER — Ambulatory Visit (HOSPITAL_COMMUNITY)
Admission: RE | Admit: 2014-01-28 | Discharge: 2014-01-28 | Disposition: A | Discharge status: Home / Self Care | Payer: Managed Care, Other (non HMO) | Source: Ambulatory Visit | Attending: Family Medicine | Admitting: Family Medicine

## 2014-01-28 DIAGNOSIS — Z1231 Encounter for screening mammogram for malignant neoplasm of breast: Secondary | ICD-10-CM | POA: Insufficient documentation

## 2014-03-11 ENCOUNTER — Ambulatory Visit (INDEPENDENT_AMBULATORY_CARE_PROVIDER_SITE_OTHER): Payer: Managed Care, Other (non HMO) | Admitting: Ophthalmology

## 2014-03-11 DIAGNOSIS — H2510 Age-related nuclear cataract, unspecified eye: Secondary | ICD-10-CM

## 2014-03-11 DIAGNOSIS — H35349 Macular cyst, hole, or pseudohole, unspecified eye: Secondary | ICD-10-CM

## 2014-03-11 DIAGNOSIS — H33009 Unspecified retinal detachment with retinal break, unspecified eye: Secondary | ICD-10-CM

## 2014-03-11 DIAGNOSIS — H33309 Unspecified retinal break, unspecified eye: Secondary | ICD-10-CM

## 2014-03-11 DIAGNOSIS — H43819 Vitreous degeneration, unspecified eye: Secondary | ICD-10-CM

## 2014-07-29 ENCOUNTER — Encounter (INDEPENDENT_AMBULATORY_CARE_PROVIDER_SITE_OTHER): Payer: Managed Care, Other (non HMO) | Admitting: Ophthalmology

## 2014-07-29 DIAGNOSIS — H43811 Vitreous degeneration, right eye: Secondary | ICD-10-CM

## 2014-07-29 DIAGNOSIS — H35342 Macular cyst, hole, or pseudohole, left eye: Secondary | ICD-10-CM

## 2014-07-29 DIAGNOSIS — H338 Other retinal detachments: Secondary | ICD-10-CM

## 2014-08-12 ENCOUNTER — Encounter: Payer: Self-pay | Admitting: Podiatry

## 2014-08-12 ENCOUNTER — Ambulatory Visit (INDEPENDENT_AMBULATORY_CARE_PROVIDER_SITE_OTHER): Payer: Managed Care, Other (non HMO) | Admitting: Podiatry

## 2014-08-12 VITALS — BP 126/74 | HR 63 | Resp 12 | Ht 67.0 in | Wt 150.0 lb

## 2014-08-12 DIAGNOSIS — L603 Nail dystrophy: Secondary | ICD-10-CM

## 2014-08-12 NOTE — Progress Notes (Signed)
   Subjective:    Patient ID: Michelle Zamora, female    DOB: [birth year], 48 y.o.   MRN: [id number]  HPI 48 year old female presents the office today for complaints of possible toenail fungus to bilateral hallux, second, fifth digits. She states that she's been applying over-the-counter treatment, undecylenic acid 25% without resolution of symptoms. She denies any pain associated with the toes or any redness or drainage from around the area. No other complaints at this time.   Review of Systems  Allergic/Immunologic: Positive for environmental allergies.  All other systems reviewed and are negative.      Objective:   Physical Exam Objective: AAO x3, NAD DP/PT pulses palpable bilaterally, CRT less than 3 seconds Protective sensation intact with Simms Weinstein monofilament, vibratory sensation intact, Achilles tendon reflex intact Bilateral hallux, second, fifth digits dystrophic, hypertrophic, discolored, brittle. There is no surrounding erythema or drainage. There is no tenderness over the nail or nail borders. No open lesions or pre-ulcerative lesions. No calf pain, swelling, warmth, erythema. 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 -Treatment options were discussed including alternatives, risks, complications. -At this time nails were biopsied and [facility] for evaluation of possible onychomycosis. Discussed with the patient various treatments for onychomycosis however we will await the results of the biopsy before starting any. -We will call her once the results of the biopsy are obtained. In the meantime, call the office with any questions, concerns, change in symptoms.

## 2014-08-12 NOTE — Patient Instructions (Signed)

## 2014-09-05 ENCOUNTER — Encounter: Payer: Self-pay | Admitting: Podiatry

## 2015-01-04 ENCOUNTER — Other Ambulatory Visit (HOSPITAL_COMMUNITY): Payer: Self-pay | Admitting: Family Medicine

## 2015-01-04 DIAGNOSIS — Z1231 Encounter for screening mammogram for malignant neoplasm of breast: Secondary | ICD-10-CM

## 2015-02-01 ENCOUNTER — Ambulatory Visit (HOSPITAL_COMMUNITY)
Admission: RE | Admit: 2015-02-01 | Discharge: 2015-02-01 | Disposition: A | Discharge status: Home / Self Care | Payer: Managed Care, Other (non HMO) | Source: Ambulatory Visit | Attending: Family Medicine | Admitting: Family Medicine

## 2015-02-01 DIAGNOSIS — Z1231 Encounter for screening mammogram for malignant neoplasm of breast: Secondary | ICD-10-CM | POA: Diagnosis not present

## 2015-02-17 ENCOUNTER — Ambulatory Visit: Payer: Managed Care, Other (non HMO) | Admitting: Diagnostic Neuroimaging

## 2015-03-17 ENCOUNTER — Ambulatory Visit (INDEPENDENT_AMBULATORY_CARE_PROVIDER_SITE_OTHER): Payer: Managed Care, Other (non HMO) | Admitting: Ophthalmology

## 2015-03-17 DIAGNOSIS — H338 Other retinal detachments: Secondary | ICD-10-CM

## 2015-03-17 DIAGNOSIS — H33301 Unspecified retinal break, right eye: Secondary | ICD-10-CM

## 2015-03-17 DIAGNOSIS — H43811 Vitreous degeneration, right eye: Secondary | ICD-10-CM

## 2015-03-17 DIAGNOSIS — H35342 Macular cyst, hole, or pseudohole, left eye: Secondary | ICD-10-CM | POA: Diagnosis not present

## 2015-12-29 ENCOUNTER — Other Ambulatory Visit: Payer: Self-pay

## 2015-12-29 DIAGNOSIS — Z1231 Encounter for screening mammogram for malignant neoplasm of breast: Secondary | ICD-10-CM

## 2016-02-02 ENCOUNTER — Ambulatory Visit
Admission: RE | Admit: 2016-02-02 | Discharge: 2016-02-02 | Disposition: A | Discharge status: Home / Self Care | Payer: Managed Care, Other (non HMO) | Source: Ambulatory Visit

## 2016-02-02 DIAGNOSIS — Z1231 Encounter for screening mammogram for malignant neoplasm of breast: Secondary | ICD-10-CM

## 2016-02-05 ENCOUNTER — Other Ambulatory Visit: Payer: Self-pay | Admitting: Family Medicine

## 2016-02-05 DIAGNOSIS — R928 Other abnormal and inconclusive findings on diagnostic imaging of breast: Secondary | ICD-10-CM

## 2016-02-21 ENCOUNTER — Ambulatory Visit
Admission: RE | Admit: 2016-02-21 | Discharge: 2016-02-21 | Disposition: A | Discharge status: Home / Self Care | Payer: Managed Care, Other (non HMO) | Source: Ambulatory Visit | Attending: Family Medicine | Admitting: Family Medicine

## 2016-02-21 DIAGNOSIS — R928 Other abnormal and inconclusive findings on diagnostic imaging of breast: Secondary | ICD-10-CM

## 2017-01-14 ENCOUNTER — Other Ambulatory Visit: Payer: Self-pay | Admitting: Family Medicine

## 2017-01-14 DIAGNOSIS — Z1231 Encounter for screening mammogram for malignant neoplasm of breast: Secondary | ICD-10-CM

## 2017-02-28 ENCOUNTER — Ambulatory Visit
Admission: RE | Admit: 2017-02-28 | Discharge: 2017-02-28 | Disposition: A | Discharge status: Home / Self Care | Payer: 59 | Source: Ambulatory Visit | Attending: Family Medicine | Admitting: Family Medicine

## 2017-02-28 DIAGNOSIS — Z1231 Encounter for screening mammogram for malignant neoplasm of breast: Secondary | ICD-10-CM

## 2017-03-21 ENCOUNTER — Ambulatory Visit (INDEPENDENT_AMBULATORY_CARE_PROVIDER_SITE_OTHER): Payer: Managed Care, Other (non HMO) | Admitting: Ophthalmology

## 2017-03-28 ENCOUNTER — Ambulatory Visit (INDEPENDENT_AMBULATORY_CARE_PROVIDER_SITE_OTHER): Payer: 59 | Admitting: Ophthalmology

## 2017-03-28 DIAGNOSIS — H338 Other retinal detachments: Secondary | ICD-10-CM

## 2017-03-28 DIAGNOSIS — H43811 Vitreous degeneration, right eye: Secondary | ICD-10-CM

## 2017-03-28 DIAGNOSIS — H35342 Macular cyst, hole, or pseudohole, left eye: Secondary | ICD-10-CM

## 2017-03-28 DIAGNOSIS — H33301 Unspecified retinal break, right eye: Secondary | ICD-10-CM

## 2017-07-23 ENCOUNTER — Encounter (INDEPENDENT_AMBULATORY_CARE_PROVIDER_SITE_OTHER): Payer: 59 | Admitting: Ophthalmology

## 2017-07-23 DIAGNOSIS — H338 Other retinal detachments: Secondary | ICD-10-CM

## 2017-07-23 DIAGNOSIS — H35342 Macular cyst, hole, or pseudohole, left eye: Secondary | ICD-10-CM | POA: Diagnosis not present

## 2017-07-23 DIAGNOSIS — H43813 Vitreous degeneration, bilateral: Secondary | ICD-10-CM

## 2017-07-23 DIAGNOSIS — H33301 Unspecified retinal break, right eye: Secondary | ICD-10-CM

## 2018-01-20 ENCOUNTER — Other Ambulatory Visit: Payer: Self-pay | Admitting: Family Medicine

## 2018-01-20 DIAGNOSIS — Z1231 Encounter for screening mammogram for malignant neoplasm of breast: Secondary | ICD-10-CM

## 2018-03-02 ENCOUNTER — Encounter: Payer: Self-pay | Admitting: Radiology

## 2018-03-02 ENCOUNTER — Ambulatory Visit
Admission: RE | Admit: 2018-03-02 | Discharge: 2018-03-02 | Disposition: A | Discharge status: Home / Self Care | Payer: 59 | Source: Ambulatory Visit | Attending: Family Medicine | Admitting: Family Medicine

## 2018-03-02 DIAGNOSIS — Z1231 Encounter for screening mammogram for malignant neoplasm of breast: Secondary | ICD-10-CM

## 2019-01-29 ENCOUNTER — Other Ambulatory Visit: Payer: Self-pay | Admitting: Family Medicine

## 2019-01-29 DIAGNOSIS — Z1231 Encounter for screening mammogram for malignant neoplasm of breast: Secondary | ICD-10-CM

## 2019-03-05 ENCOUNTER — Other Ambulatory Visit: Payer: Self-pay

## 2019-03-05 ENCOUNTER — Encounter (INDEPENDENT_AMBULATORY_CARE_PROVIDER_SITE_OTHER): Payer: 59 | Admitting: Ophthalmology

## 2019-03-05 DIAGNOSIS — H338 Other retinal detachments: Secondary | ICD-10-CM | POA: Diagnosis not present

## 2019-03-05 DIAGNOSIS — H43811 Vitreous degeneration, right eye: Secondary | ICD-10-CM | POA: Diagnosis not present

## 2019-03-05 DIAGNOSIS — H35342 Macular cyst, hole, or pseudohole, left eye: Secondary | ICD-10-CM | POA: Diagnosis not present

## 2019-03-26 ENCOUNTER — Other Ambulatory Visit: Payer: Self-pay

## 2019-03-26 ENCOUNTER — Ambulatory Visit
Admission: RE | Admit: 2019-03-26 | Discharge: 2019-03-26 | Disposition: A | Discharge status: Home / Self Care | Payer: 59 | Source: Ambulatory Visit | Attending: Family Medicine | Admitting: Family Medicine

## 2019-03-26 DIAGNOSIS — Z1231 Encounter for screening mammogram for malignant neoplasm of breast: Secondary | ICD-10-CM

## 2020-02-14 ENCOUNTER — Other Ambulatory Visit: Payer: Self-pay | Admitting: Family Medicine

## 2020-02-14 DIAGNOSIS — Z1231 Encounter for screening mammogram for malignant neoplasm of breast: Secondary | ICD-10-CM

## 2020-03-27 ENCOUNTER — Other Ambulatory Visit: Payer: Self-pay

## 2020-03-27 ENCOUNTER — Ambulatory Visit
Admission: RE | Admit: 2020-03-27 | Discharge: 2020-03-27 | Disposition: A | Discharge status: Home / Self Care | Payer: 59 | Source: Ambulatory Visit | Attending: Family Medicine | Admitting: Family Medicine

## 2020-03-27 DIAGNOSIS — Z1231 Encounter for screening mammogram for malignant neoplasm of breast: Secondary | ICD-10-CM

## 2020-09-01 ENCOUNTER — Encounter (INDEPENDENT_AMBULATORY_CARE_PROVIDER_SITE_OTHER): Payer: 59 | Admitting: Ophthalmology

## 2020-09-01 ENCOUNTER — Other Ambulatory Visit: Payer: Self-pay

## 2020-09-01 DIAGNOSIS — H35342 Macular cyst, hole, or pseudohole, left eye: Secondary | ICD-10-CM | POA: Diagnosis not present

## 2020-09-01 DIAGNOSIS — H338 Other retinal detachments: Secondary | ICD-10-CM

## 2020-09-01 DIAGNOSIS — H33301 Unspecified retinal break, right eye: Secondary | ICD-10-CM

## 2020-09-01 DIAGNOSIS — H43811 Vitreous degeneration, right eye: Secondary | ICD-10-CM

## 2020-10-01 NOTE — Progress Notes (Signed)
Cardiology Office Note:    Date:  10/03/2020   ID:  Michelle Zamora, DOB [birth year], MRN [id number]  PCP:  Michelle Rua, MD  [facility] Cardiologist:  No primary care provider on file.  [facility] Electrophysiologist:  None   Referring MD: Michelle Rua, MD   Chief Complaint  Patient presents with  . Hyperlipidemia     Jan. 4, 2022   Michelle Zamora is a 55 y.o. female with a hx of hyperlipidemia.  We were asked to see her today for further management of her hyperliidemia by Michelle. Lenise Zamora.  I know Michelle Zamora from Michelle Zamora spin class .  She has continued to ride at home   Labs from her primary medical doctor reveal a total cholesterol of 222.  Her HDL is 72.  The triglyceride level is 66.  The LDL is 138.  I saw Michelle Zamora years ago ,  She has hx of MVP Mother has hx of CAD , CABG , died at age 54 Father was healthy,  Died in Jan 12, 2019 of COVID   She still rides several times a week  No CP when she rides .    Past Medical History:  Diagnosis Date  . Complication of anesthesia   . Macular hole   . MVP (mitral valve prolapse)    was monitored by Michelle Zamora- no need for follow up.  Michelle Zamora Kitchen PONV (postoperative nausea and vomiting)     Past Surgical History:  Procedure Laterality Date  . 25 GAUGE PARS PLANA VITRECTOMY WITH 20 GAUGE MVR PORT FOR MACULAR HOLE Left 11/24/2012   Procedure: 25 GAUGE PARS PLANA VITRECTOMY WITH 20 GAUGE MVR PORT FOR MACULAR HOLE;  Surgeon: Michelle George, MD;  Location: [facility] OR;  Service: Ophthalmology;  Laterality: Left;  . EYE SURGERY Left 2004/01/12  . FINGER SURGERY Left 2002-01-11   repair Left ring finger.  Michelle Zamora Kitchen GAS INSERTION Left 11/24/2012   Procedure: INSERTION OF GAS;  Surgeon: Michelle George, MD;  Location: [facility] OR;  Service: Ophthalmology;  Laterality: Left;  C3F8  . MEMBRANE PEEL Left 11/24/2012   Procedure: MEMBRANE PEEL;  Surgeon: Michelle George, MD;  Location: [facility] OR;  Service: Ophthalmology;  Laterality: Left;  . PHOTOCOAGULATION WITH LASER Left  11/24/2012   Procedure: PHOTOCOAGULATION WITH LASER;  Surgeon: Michelle George, MD;  Location: [facility] OR;  Service: Ophthalmology;  Laterality: Left;  ENDOLASER  . SERUM PATCH Left 11/24/2012   Procedure: SERUM PATCH;  Surgeon: Michelle George, MD;  Location: [facility] OR;  Service: Ophthalmology;  Laterality: Left;  Michelle Zamora Kitchen VARICOSE VEIN SURGERY Bilateral 01-11-05  . VITERECTOMY  11/24/2012   Michelle Zamora    Current Medications: Current Meds  Medication Sig  . Calcium Carbonate-Vitamin D (CALCIUM 600 + D PO) Take 1 tablet by mouth daily.  . cetirizine (ZYRTEC) 10 MG tablet Take 1 tablet by mouth as needed.  . fexofenadine (ALLEGRA) 180 MG tablet Take 1 tablet by mouth as needed.  . latanoprost (XALATAN) 0.005 % ophthalmic solution Place 1 drop into both eyes daily in the afternoon.  . Multiple Vitamin (MULTIVITAMIN WITH MINERALS) TABS Take 1 tablet by mouth daily.  . timolol (TIMOPTIC) 0.5 % ophthalmic solution Place 1 drop into both eyes 2 (two) times daily.  . [DISCONTINUED] lovastatin (MEVACOR) 10 MG tablet Take 1 tablet by mouth daily in the afternoon.     Allergies:   Patient has no known allergies.   Social History   Socioeconomic History  . Marital status:  Married    Spouse name: Not on file  . Number of children: Not on file  . Years of education: Not on file  . Highest education level: Not on file  Occupational History  . Not on file  Tobacco Use  . Smoking status: Never Smoker  . Smokeless tobacco: Never Used  Substance and Sexual Activity  . Alcohol use: No  . Drug use: No  . Sexual activity: Not on file  Other Topics Concern  . Not on file  Social History Narrative  . Not on file   Social Determinants of Health   Financial Resource Strain: Not on file  Food Insecurity: Not on file  Transportation Needs: Not on file  Physical Activity: Not on file  Stress: Not on file  Social Connections: Not on file     Family History: The patient's family history includes Breast cancer  (age of onset: 23) in her sister; CAD in her mother.  ROS:   Please see the history of present illness.     All other systems reviewed and are negative.  EKGs/Labs/Other Studies Reviewed:    The following studies were reviewed today:   EKG:   Jan. 4, 2022:  Sinus brady at 45.  No ST or T wave chnages.   Recent Labs: No results found for requested labs within last 8760 hours.  Recent Lipid Panel No results found for: CHOL, TRIG, HDL, CHOLHDL, VLDL, LDLCALC, LDLDIRECT   Risk Assessment/Calculations:       Physical Exam:    VS:  BP 104/80   Pulse (!) 45   Ht 5\' 7"  (1.702 m)   Wt 147 lb 3.2 oz (66.8 kg)   SpO2 99%   BMI 23.05 kg/m     Wt Readings from Last 3 Encounters:  10/03/20 147 lb 3.2 oz (66.8 kg)  08/12/14 150 lb (68 kg)     GEN:  Well nourished, well developed in no acute distress HEENT: Normal NECK: No JVD; No carotid bruits LYMPHATICS: No lymphadenopathy CARDIAC: RR,   Very soft systolic murmur,  No click heard  RESPIRATORY:  Clear to auscultation without rales, wheezing or rhonchi  ABDOMEN: Soft, non-tender, non-distended MUSCULOSKELETAL:  No edema; No deformity  SKIN: Warm and dry NEUROLOGIC:  Alert and oriented x 3 PSYCHIATRIC:  Normal affect   ASSESSMENT:    No diagnosis found. PLAN:    In order of problems listed above:  1. Hyperlipidemia:  08/14/14 presents for further evaluation and management of her hyperlipidemia.  Her mother died at age 58 due to cardiac disease.  This has mildly elevated LDL with an LDL of 138.  She exercises fairly well.  Her diet is good.  I would like to get a coronary calcium score for further evaluation.  Will determine whether or not she needs to be started on a statin based on the coronary calcium score.  I would have a low threshold to place her on a PCSK9 inhibitor if she does not tolerate the statin.  2.  Mitral valve prolapse: She has a very soft systolic murmur.  I did not hear a midsystolic click.         Medication Adjustments/Labs and Tests Ordered: Current medicines are reviewed at length with the patient today.  Concerns regarding medicines are outlined above.  No orders of the defined types were placed in this encounter.  No orders of the defined types were placed in this encounter.   There are no Patient Instructions on file for  this visit.   Signed, Mertie Moores, MD  10/03/2020 9:38 AM    [facility]

## 2020-10-03 ENCOUNTER — Ambulatory Visit: Payer: 59 | Admitting: Cardiovascular Disease

## 2020-10-03 ENCOUNTER — Encounter: Payer: Self-pay | Admitting: Cardiovascular Disease

## 2020-10-03 ENCOUNTER — Other Ambulatory Visit: Payer: Self-pay

## 2020-10-03 VITALS — BP 104/80 | HR 45 | Ht 67.0 in | Wt 147.2 lb

## 2020-10-03 DIAGNOSIS — I341 Nonrheumatic mitral (valve) prolapse: Secondary | ICD-10-CM

## 2020-10-03 DIAGNOSIS — E785 Hyperlipidemia, unspecified: Secondary | ICD-10-CM | POA: Diagnosis not present

## 2020-10-03 NOTE — Patient Instructions (Addendum)
Medication Instructions:  Your provider recommends that you continue on your current medications as directed. Please refer to the Current Medication list given to you today.   *If you need a refill on your cardiac medications before your next appointment, please call your pharmacy*  Testing/Procedures: Dr. Elease Hashimoto recommends you have a CORONARY CALCIUM SCORE.  Follow-Up: At [facility], you and your health needs are our priority.  As part of our continuing mission to provide you with exceptional heart care, we have created designated Provider Care Teams.  These Care Teams include your primary Cardiologist (physician) and Advanced Practice Providers (APPs -  Physician Assistants and Nurse Practitioners) who all work together to provide you with the care you need, when you need it. Your next appointment:   6 month(s) (You will have fasting labs drawn a few days prior to this appointment) The format for your next appointment:   In Person Provider:   You may see Dr. Elease Hashimoto or one of the following Advanced Practice Providers on your designated Care Team:    Tereso Newcomer, PA-C  Vin Goodman, [place]

## 2020-10-09 ENCOUNTER — Ambulatory Visit (INDEPENDENT_AMBULATORY_CARE_PROVIDER_SITE_OTHER)
Admission: RE | Admit: 2020-10-09 | Discharge: 2020-10-09 | Disposition: A | Discharge status: Home / Self Care | Payer: Self-pay | Source: Ambulatory Visit | Attending: Cardiovascular Disease | Admitting: Cardiovascular Disease

## 2020-10-09 ENCOUNTER — Other Ambulatory Visit: Payer: Self-pay

## 2020-10-09 DIAGNOSIS — E785 Hyperlipidemia, unspecified: Secondary | ICD-10-CM

## 2020-10-09 DIAGNOSIS — I341 Nonrheumatic mitral (valve) prolapse: Secondary | ICD-10-CM

## 2020-10-10 ENCOUNTER — Telehealth: Payer: Self-pay | Admitting: *Deleted

## 2020-10-10 DIAGNOSIS — E785 Hyperlipidemia, unspecified: Secondary | ICD-10-CM

## 2020-10-10 MED ORDER — ROSUVASTATIN CALCIUM 10 MG PO TABS: 10.0000 mg | ORAL_TABLET | Freq: Every day | ORAL | 3 refills | Status: DC

## 2020-10-10 NOTE — Telephone Encounter (Signed)
The patient has been notified of the result and verbalized understanding. All questions (if any) were answered. Sampson Goon, RN 10/10/2020 2:26 PM  Ordered labs and medication has been sent to requested pharmacy.  Patient agreeable and aware.

## 2020-10-10 NOTE — Telephone Encounter (Signed)
-----   Message from Vesta Mixer, MD sent at 10/09/2020  5:10 PM EST ----- Coronary calcium score of 52.5. This was 92nd percentile for age and sex matched control. She has a family hx of CAD. Start rosuvastatin 10 mg a day .   Check lipids, liver enz, bmp in 3 months.   She has an appt to see me in 6 months.  Ive called and discussed this with her and she understands.

## 2020-12-29 ENCOUNTER — Encounter (INDEPENDENT_AMBULATORY_CARE_PROVIDER_SITE_OTHER): Payer: 59 | Admitting: Ophthalmology

## 2020-12-29 ENCOUNTER — Other Ambulatory Visit: Payer: Self-pay

## 2020-12-29 DIAGNOSIS — H338 Other retinal detachments: Secondary | ICD-10-CM

## 2020-12-29 DIAGNOSIS — H43811 Vitreous degeneration, right eye: Secondary | ICD-10-CM

## 2020-12-29 DIAGNOSIS — H33301 Unspecified retinal break, right eye: Secondary | ICD-10-CM | POA: Diagnosis not present

## 2021-01-01 ENCOUNTER — Other Ambulatory Visit: Payer: 59 | Admitting: *Deleted

## 2021-01-01 ENCOUNTER — Other Ambulatory Visit: Payer: Self-pay

## 2021-01-01 DIAGNOSIS — E785 Hyperlipidemia, unspecified: Secondary | ICD-10-CM

## 2021-01-01 LAB — HEPATIC FUNCTION PANEL
ALT: 15 IU/L (ref 0–32)
AST: 15 IU/L (ref 0–40)
Albumin: 4.5 g/dL (ref 3.8–4.9)
Alkaline Phosphatase: 54 IU/L (ref 44–121)
Bilirubin Total: 0.7 mg/dL (ref 0.0–1.2)
Bilirubin, Direct: 0.19 mg/dL (ref 0.00–0.40)
Total Protein: 6.8 g/dL (ref 6.0–8.5)

## 2021-01-01 LAB — BASIC METABOLIC PANEL
BUN/Creatinine Ratio: 16 (ref 9–23)
BUN: 15 mg/dL (ref 6–24)
CO2: 26 mmol/L (ref 20–29)
Calcium: 9.4 mg/dL (ref 8.7–10.2)
Chloride: 101 mmol/L (ref 96–106)
Creatinine, Ser: 0.91 mg/dL (ref 0.57–1.00)
Glucose: 94 mg/dL (ref 65–99)
Potassium: 4.3 mmol/L (ref 3.5–5.2)
Sodium: 140 mmol/L (ref 134–144)
eGFR: 75 mL/min/{1.73_m2} (ref >59)

## 2021-01-01 LAB — LIPID PANEL
Chol/HDL Ratio: 2.2 ratio (ref 0.0–4.4)
Cholesterol, Total: 133 mg/dL (ref 100–199)
HDL: 61 mg/dL (ref >39)
LDL Chol Calc (NIH): 59 mg/dL (ref 0–99)
Triglycerides: 60 mg/dL (ref 0–149)
VLDL Cholesterol Cal: 13 mg/dL (ref 5–40)

## 2021-02-09 ENCOUNTER — Encounter (INDEPENDENT_AMBULATORY_CARE_PROVIDER_SITE_OTHER): Payer: 59 | Admitting: Ophthalmology

## 2021-02-09 DIAGNOSIS — H35342 Macular cyst, hole, or pseudohole, left eye: Secondary | ICD-10-CM

## 2021-02-09 DIAGNOSIS — H338 Other retinal detachments: Secondary | ICD-10-CM

## 2021-02-09 DIAGNOSIS — H43811 Vitreous degeneration, right eye: Secondary | ICD-10-CM

## 2021-02-23 ENCOUNTER — Other Ambulatory Visit: Payer: Self-pay | Admitting: Family Medicine

## 2021-02-23 DIAGNOSIS — Z1231 Encounter for screening mammogram for malignant neoplasm of breast: Secondary | ICD-10-CM

## 2021-04-05 ENCOUNTER — Encounter: Payer: Self-pay | Admitting: Cardiovascular Disease

## 2021-04-05 NOTE — Progress Notes (Signed)
Cardiology Office Note:    Date:  04/12/2021   ID:  Michelle Zamora, DOB [birth year], MRN [id number]  PCP:  [name], MD  [facility] Cardiologist:  Kristeen Miss, MD  [facility] Electrophysiologist:  None   Referring MD: [name], MD   Chief Complaint  Patient presents with   Hyperlipidemia     Jan. 4, 2022   Michelle Zamora is a 55 y.o. female with a hx of hyperlipidemia.  We were asked to see her today for further management of her hyperliidemia by Dr. Lenise Arena.  I know Michelle Zamora from [facility] spin class .  She has continued to ride at home   Labs from her primary medical doctor reveal a total cholesterol of 222.  Her HDL is 72.  The triglyceride level is 66.  The LDL is 138.  I saw Michelle Zamora years ago ,  She has hx of MVP Mother has hx of CAD , CABG , died at age 27 Father was healthy,  Died in Jan 30, 2019 of COVID   She still rides several times a week  No CP when she rides .   April 12, 2021: Michelle Zamora is seen today for follow-up of her hyperlipidemia.  She has a family history of coronary artery disease with early cardiac death.  She also has a history of mitral valve prolapse.   Coronary calcium score of 52.5. This was 92nd percentile for age and sex matched control. She is on rosuvastatin 10 mg a day  Exercising regularly , no CP with exercise   Last Friday,  had 5 hard / fast heart beats . While at rest, in evening  Has felt a soreness. Sore front and back,  between shoulder blades  Has continued to exercise without any difficulty Chest and back are Still a little sore  No cough No covid symptoms Able to take a deep breath   That episode ended and did not cause any trouble but she has had a persistent chest soreness both on her chest and back since that time.  The pain is not related to eating, drinking, change of position, taking a deep breath.  There is no rib tenderness.  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     Past Medical  History:  Diagnosis Date   Complication of anesthesia    Macular hole    MVP (mitral valve prolapse)    was monitored by Dr Carney Harder- no need for follow up.   PONV (postoperative nausea and vomiting)     Past Surgical History:  Procedure Laterality Date   25 GAUGE PARS PLANA VITRECTOMY WITH 20 GAUGE MVR PORT FOR MACULAR HOLE Left 11/24/2012   Procedure: 25 GAUGE PARS PLANA VITRECTOMY WITH 20 GAUGE MVR PORT FOR MACULAR HOLE;  Surgeon: Sherrie George, MD;  Location: [facility] OR;  Service: Ophthalmology;  Laterality: Left;   EYE SURGERY Left 01-30-2004   FINGER SURGERY Left 29-Jan-2002   repair Left ring finger.   GAS INSERTION Left 11/24/2012   Procedure: INSERTION OF GAS;  Surgeon: Sherrie George, MD;  Location: [facility] OR;  Service: Ophthalmology;  Laterality: Left;  C3F8   MEMBRANE PEEL Left 11/24/2012   Procedure: MEMBRANE PEEL;  Surgeon: Sherrie George, MD;  Location: [facility] OR;  Service: Ophthalmology;  Laterality: Left;   PHOTOCOAGULATION WITH LASER Left 11/24/2012   Procedure: PHOTOCOAGULATION WITH LASER;  Surgeon: Sherrie George, MD;  Location: [facility] OR;  Service: Ophthalmology;  Laterality: Left;  ENDOLASER  SERUM PATCH Left 11/24/2012   Procedure: SERUM PATCH;  Surgeon: Sherrie George, MD;  Location: [facility] OR;  Service: Ophthalmology;  Laterality: Left;   VARICOSE VEIN SURGERY Bilateral 2006   VITERECTOMY  11/24/2012   DR MATTHEWS    Current Medications: Current Meds  Medication Sig   Calcium Carbonate-Vitamin D (CALCIUM 600 + D PO) Take 1 tablet by mouth daily.   cetirizine (ZYRTEC) 10 MG tablet Take 1 tablet by mouth as needed.   fexofenadine (ALLEGRA) 180 MG tablet Take 1 tablet by mouth as needed.   latanoprost (XALATAN) 0.005 % ophthalmic solution Place 1 drop into both eyes daily in the afternoon.   Multiple Vitamin (MULTIVITAMIN WITH MINERALS) TABS Take 1 tablet by mouth daily.   rosuvastatin (CRESTOR) 10 MG tablet Take 1 tablet (10 mg total) by mouth daily.   timolol (TIMOPTIC) 0.5 % ophthalmic  solution Place 1 drop into both eyes 2 (two) times daily.     Allergies:   Patient has no known allergies.   Social History   Socioeconomic History   Marital status: Married    Spouse name: Not on file   Number of children: Not on file   Years of education: Not on file   Highest education level: Not on file  Occupational History   Not on file  Tobacco Use   Smoking status: Never   Smokeless tobacco: Never  Substance and Sexual Activity   Alcohol use: No   Drug use: No   Sexual activity: Not on file  Other Topics Concern   Not on file  Social History Narrative   Not on file   Social Determinants of Health   Financial Resource Strain: Not on file  Food Insecurity: Not on file  Transportation Needs: Not on file  Physical Activity: Not on file  Stress: Not on file  Social Connections: Not on file     Family History: The patient's family history includes Breast cancer (age of onset: 26) in her sister; CAD in her mother.  ROS:   Please see the history of present illness.     All other systems reviewed and are negative.  EKGs/Labs/Other Studies Reviewed:    The following studies were reviewed today:   EKG:    April 12, 2021: Sinus bradycardia at 46.  No ST or T wave changes.  Recent Labs: 01/01/2021: ALT 15; BUN 15; Creatinine, Ser 0.91; Potassium 4.3; Sodium 140  Recent Lipid Panel    Component Value Date/Time   CHOL 133 01/01/2021 0732   TRIG 60 01/01/2021 0732   HDL 61 01/01/2021 0732   CHOLHDL 2.2 01/01/2021 0732   LDLCALC 59 01/01/2021 0732     Risk Assessment/Calculations:       Physical Exam:     Physical Exam: Blood pressure 128/62, pulse 60, height 5\' 7"  (1.702 m), weight 150 lb (68 kg), SpO2 97 %.  GEN:  Well nourished, well developed in no acute distress HEENT: Normal NECK: No JVD; No carotid bruits LYMPHATICS: No lymphadenopathy CARDIAC: RRR , intermittent midsystolic click.  I did not hear any murmur.  No chest wall tenderness.   RESPIRATORY:  Clear to auscultation without rales, wheezing or rhonchi  ABDOMEN: Soft, non-tender, non-distended MUSCULOSKELETAL:  No edema; No deformity  SKIN: Warm and dry NEUROLOGIC:  Alert and oriented x 3   ASSESSMENT:    1. Precordial pain    PLAN:      1.  Chest discomfort: She had an episode of chest discomfort associated  with several seconds of palpitations.  That episode ended and did not cause any trouble but she has had a persistent chest soreness both on her chest and back since that time.  The pain is not related to eating, drinking, change of position, taking a deep breath.  There is no rib tenderness.  She has a very strong family history of premature coronary artery disease.  Her EKG does not show any evidence of a silent myocardial infarction.  I would like to proceed with an exercise Myoview study for further evaluation.  I will see her back in several months.  Hyperlipidemia: Her last lipid levels look good.  She is on rosuvastatin.  She does have an elevated coronary calcium score.  Lipids look good     2.  Mitral valve prolapse:   3.  Chest pain :  Michelle IvanLiz had an episode of CP associatted with palpitations .   Has had anterior and posterior chest pressure since that time. Troponin is negative. Will arrange for a stress myoview for further evaluation  She has a family hx of premature CAD        Medication Adjustments/Labs and Tests Ordered: Current medicines are reviewed at length with the patient today.  Concerns regarding medicines are outlined above.  No orders of the defined types were placed in this encounter.  No orders of the defined types were placed in this encounter.   Patient Instructions  Medication Instructions:  Your physician recommends that you continue on your current medications as directed. Please refer to the Current Medication list given to you today.  *If you need a refill on your cardiac medications before your next appointment,  please call your pharmacy*   Lab Work: TODAY - troponin  If you have labs (blood work) drawn today and your tests are completely normal, you will receive your results only by: MyChart Message (if you have MyChart) OR A paper copy in the mail If you have any lab test that is abnormal or we need to change your treatment, we will call you to review the results.   Testing/Procedures: Your physician has requested that you have an exercise stress myoview. For further information please visit [email or web address]. Please follow instruction sheet, as given.  ZIO XT- Long Term Monitor Instructions  Your physician has requested you wear a ZIO patch monitor for 14 days.  This is a single patch monitor. [employer] supplies one patch monitor per enrollment. Additional stickers are not available. Please do not apply patch if you will be having a Nuclear Stress Test,  Echocardiogram, Cardiac CT, MRI, or Chest Xray during the period you would be wearing the  monitor. The patch cannot be worn during these tests. You cannot remove and re-apply the  ZIO XT patch monitor.  Your ZIO patch monitor will be mailed 3 day USPS to your address on file. It may take 3-5 days  to receive your monitor after you have been enrolled.  Once you have received your monitor, please review the enclosed instructions. Your monitor  has already been registered assigning a specific monitor serial # to you.  Billing and Patient Assistance Program Information  We have supplied [employer] with any of your insurance information on file for billing purposes. [employer] offers a sliding scale Patient Assistance Program for patients that do not have  insurance, or whose insurance does not completely cover the cost of the ZIO monitor.  You must apply for the Patient Assistance Program to qualify for this discounted rate.  To apply, please call [employer] at [phone], select option 4, select option 2, ask to apply for  Patient Assistance  Program. Meredeth Ide will ask your household income, and how many people  are in your household. They will quote your out-of-pocket cost based on that information.  [employer] will also be able to set up a 2-month, interest-free payment plan if needed.  Applying the monitor   Shave hair from upper left chest.  Hold abrader disc by orange tab. Rub abrader in 40 strokes over the upper left chest as  indicated in your monitor instructions.  Clean area with 4 enclosed alcohol pads. Let dry.  Apply patch as indicated in monitor instructions. Patch will be placed under collarbone on left  side of chest with arrow pointing upward.  Rub patch adhesive wings for 2 minutes. Remove white label marked "1". Remove the white  label marked "2". Rub patch adhesive wings for 2 additional minutes.  While looking in a mirror, press and release button in center of patch. A small green light will  flash 3-4 times. This will be your only indicator that the monitor has been turned on.  Do not shower for the first 24 hours. You may shower after the first 24 hours.  Press the button if you feel a symptom. You will hear a small click. Record Date, Time and  Symptom in the Patient Logbook.  When you are ready to remove the patch, follow instructions on the last 2 pages of Patient  Logbook. Stick patch monitor onto the last page of Patient Logbook.  Place Patient Logbook in the blue and white box. Use locking tab on box and tape box closed  securely. The blue and white box has prepaid postage on it. Please place it in the mailbox as  soon as possible. Your physician should have your test results approximately 7 days after the  monitor has been mailed back to [facility].  Call [facility] Customer Care at [phone] if you have questions regarding  your ZIO XT patch monitor. Call them immediately if you see an orange light blinking on your  monitor.  If your monitor falls off in less than 4 days, contact our  Monitor department at [phone].  If your monitor becomes loose or falls off after 4 days call [employer] at [phone] for  suggestions on securing your monitor   Follow-Up: At [facility], you and your health needs are our priority.  As part of our continuing mission to provide you with exceptional heart care, we have created designated Provider Care Teams.  These Care Teams include your primary Cardiologist (physician) and Advanced Practice Providers (APPs -  Physician Assistants and Nurse Practitioners) who all work together to provide you with the care you need, when you need it.  Your next appointment:   3 month(s)  The format for your next appointment:   In Person  Provider:   Kristeen Miss, MD      Signed, Kristeen Miss, MD  04/12/2021 11:03 AM    [facility]

## 2021-04-12 ENCOUNTER — Ambulatory Visit (INDEPENDENT_AMBULATORY_CARE_PROVIDER_SITE_OTHER): Payer: 59

## 2021-04-12 ENCOUNTER — Encounter: Payer: Self-pay | Admitting: Cardiovascular Disease

## 2021-04-12 ENCOUNTER — Other Ambulatory Visit: Payer: Self-pay

## 2021-04-12 ENCOUNTER — Encounter: Payer: Self-pay | Admitting: Nurse Practitioner

## 2021-04-12 ENCOUNTER — Ambulatory Visit: Payer: 59 | Admitting: Cardiovascular Disease

## 2021-04-12 VITALS — BP 128/62 | HR 60 | Ht 67.0 in | Wt 150.0 lb

## 2021-04-12 DIAGNOSIS — R072 Precordial pain: Secondary | ICD-10-CM | POA: Diagnosis not present

## 2021-04-12 DIAGNOSIS — E785 Hyperlipidemia, unspecified: Secondary | ICD-10-CM | POA: Insufficient documentation

## 2021-04-12 DIAGNOSIS — R002 Palpitations: Secondary | ICD-10-CM | POA: Diagnosis not present

## 2021-04-12 DIAGNOSIS — E782 Mixed hyperlipidemia: Secondary | ICD-10-CM

## 2021-04-12 DIAGNOSIS — I200 Unstable angina: Secondary | ICD-10-CM | POA: Insufficient documentation

## 2021-04-12 LAB — TROPONIN T: Troponin T (Highly Sensitive): <6 ng/L (ref 0–14)

## 2021-04-12 NOTE — Patient Instructions (Addendum)
Medication Instructions:  Your physician recommends that you continue on your current medications as directed. Please refer to the Current Medication list given to you today.  *If you need a refill on your cardiac medications before your next appointment, please call your pharmacy*   Lab Work: TODAY - Troponin If you have labs (blood work) drawn today and your tests are completely normal, you will receive your results only by: MyChart Message (if you have MyChart) OR A paper copy in the mail If you have any lab test that is abnormal or we need to change your treatment, we will call you to review the results.   Testing/Procedures: Your physician has requested that you have an exercise stress myoview. For further information please visit [email or web address] Please follow instruction sheet, as given.  ZIO XT- Long Term Monitor Instructions  Your physician has requested you wear a ZIO patch monitor for 14 days.  This is a single patch monitor. [employer] supplies one patch monitor per enrollment. Additional stickers are not available. Please do not apply patch if you will be having a Nuclear Stress Test,  Echocardiogram, Cardiac CT, MRI, or Chest Xray during the period you would be wearing the  monitor. The patch cannot be worn during these tests. You cannot remove and re-apply the  ZIO XT patch monitor.  Your ZIO patch monitor will be mailed 3 day USPS to your address on file. It may take 3-5 days  to receive your monitor after you have been enrolled.  Once you have received your monitor, please review the enclosed instructions. Your monitor  has already been registered assigning a specific monitor serial # to you.  Billing and Patient Assistance Program Information  We have supplied [employer] with any of your insurance information on file for billing purposes. [employer] offers a sliding scale Patient Assistance Program for patients that do not have  insurance, or whose insurance does not  completely cover the cost of the ZIO monitor.  You must apply for the Patient Assistance Program to qualify for this discounted rate.  To apply, please call [employer] at [phone], select option 4, select option 2, ask to apply for  Patient Assistance Program. Meredeth Ide will ask your household income, and how many people  are in your household. They will quote your out-of-pocket cost based on that information.  [employer] will also be able to set up a 78-month, interest-free payment plan if needed.  Applying the monitor   Shave hair from upper left chest.  Hold abrader disc by orange tab. Rub abrader in 40 strokes over the upper left chest as  indicated in your monitor instructions.  Clean area with 4 enclosed alcohol pads. Let dry.  Apply patch as indicated in monitor instructions. Patch will be placed under collarbone on left  side of chest with arrow pointing upward.  Rub patch adhesive wings for 2 minutes. Remove white label marked "1". Remove the white  label marked "2". Rub patch adhesive wings for 2 additional minutes.  While looking in a mirror, press and release button in center of patch. A small green light will  flash 3-4 times. This will be your only indicator that the monitor has been turned on.  Do not shower for the first 24 hours. You may shower after the first 24 hours.  Press the button if you feel a symptom. You will hear a small click. Record Date, Time and  Symptom in the Patient Logbook.  When you are ready to remove the patch,  follow instructions on the last 2 pages of Patient  Logbook. Stick patch monitor onto the last page of Patient Logbook.  Place Patient Logbook in the blue and white box. Use locking tab on box and tape box closed  securely. The blue and white box has prepaid postage on it. Please place it in the mailbox as  soon as possible. Your physician should have your test results approximately 7 days after the  monitor has been mailed back to [facility].  Call  [facility] Customer Care at [phone] if you have questions regarding  your ZIO XT patch monitor. Call them immediately if you see an orange light blinking on your  monitor.  If your monitor falls off in less than 4 days, contact our Monitor department at [phone].  If your monitor becomes loose or falls off after 4 days call [employer] at [phone] for  suggestions on securing your monitor   Follow-Up: At [facility], you and your health needs are our priority.  As part of our continuing mission to provide you with exceptional heart care, we have created designated Provider Care Teams.  These Care Teams include your primary Cardiologist (physician) and Advanced Practice Providers (APPs -  Physician Assistants and Nurse Practitioners) who all work together to provide you with the care you need, when you need it.  Your next appointment:   3 month(s)  The format for your next appointment:   In Person  Provider:   Kristeen Miss, MD

## 2021-04-13 ENCOUNTER — Ambulatory Visit: Payer: 59 | Admitting: Cardiovascular Disease

## 2021-04-16 DIAGNOSIS — R072 Precordial pain: Secondary | ICD-10-CM

## 2021-04-16 DIAGNOSIS — E782 Mixed hyperlipidemia: Secondary | ICD-10-CM | POA: Diagnosis not present

## 2021-04-16 DIAGNOSIS — I200 Unstable angina: Secondary | ICD-10-CM

## 2021-04-16 DIAGNOSIS — R002 Palpitations: Secondary | ICD-10-CM

## 2021-04-25 ENCOUNTER — Ambulatory Visit: Payer: 59

## 2021-05-01 ENCOUNTER — Ambulatory Visit
Admission: RE | Admit: 2021-05-01 | Discharge: 2021-05-01 | Disposition: A | Discharge status: Home / Self Care | Payer: 59 | Source: Ambulatory Visit | Attending: Family Medicine | Admitting: Family Medicine

## 2021-05-01 ENCOUNTER — Other Ambulatory Visit: Payer: Self-pay

## 2021-05-01 DIAGNOSIS — Z1231 Encounter for screening mammogram for malignant neoplasm of breast: Secondary | ICD-10-CM

## 2021-05-03 ENCOUNTER — Emergency Department (HOSPITAL_COMMUNITY): Payer: 59

## 2021-05-03 ENCOUNTER — Emergency Department (HOSPITAL_COMMUNITY)
Admission: EM | Admit: 2021-05-03 | Discharge: 2021-05-03 | Disposition: A | Discharge status: Home / Self Care | Payer: 59 | Attending: Emergency Medicine | Admitting: Emergency Medicine

## 2021-05-03 ENCOUNTER — Telehealth: Payer: Self-pay

## 2021-05-03 ENCOUNTER — Encounter (HOSPITAL_COMMUNITY): Payer: Self-pay | Admitting: Emergency Medicine

## 2021-05-03 DIAGNOSIS — R001 Bradycardia, unspecified: Secondary | ICD-10-CM | POA: Insufficient documentation

## 2021-05-03 DIAGNOSIS — R0789 Other chest pain: Secondary | ICD-10-CM | POA: Diagnosis not present

## 2021-05-03 DIAGNOSIS — R202 Paresthesia of skin: Secondary | ICD-10-CM | POA: Diagnosis not present

## 2021-05-03 HISTORY — DX: Unspecified glaucoma: H40.9

## 2021-05-03 LAB — BASIC METABOLIC PANEL
Anion gap: 5 (ref 5–15)
BUN: 18 mg/dL (ref 6–20)
CO2: 30 mmol/L (ref 22–32)
Calcium: 9.6 mg/dL (ref 8.9–10.3)
Chloride: 102 mmol/L (ref 98–111)
Creatinine, Ser: 0.95 mg/dL (ref 0.44–1.00)
GFR, Estimated: >60 mL/min (ref >60)
Glucose, Bld: 101 mg/dL — ABNORMAL HIGH (ref 70–99)
Potassium: 4.4 mmol/L (ref 3.5–5.1)
Sodium: 137 mmol/L (ref 135–145)

## 2021-05-03 LAB — CBC WITH DIFFERENTIAL/PLATELET
Abs Immature Granulocytes: 0.01 10*3/uL (ref 0.00–0.07)
Basophils Absolute: 0.1 10*3/uL (ref 0.0–0.1)
Basophils Relative: 1 %
Eosinophils Absolute: 0 10*3/uL (ref 0.0–0.5)
Eosinophils Relative: 0 %
HCT: 45.5 % (ref 36.0–46.0)
Hemoglobin: 15.6 g/dL — ABNORMAL HIGH (ref 12.0–15.0)
Immature Granulocytes: 0 %
Lymphocytes Relative: 20 %
Lymphs Abs: 1.3 10*3/uL (ref 0.7–4.0)
MCH: 33.1 pg (ref 26.0–34.0)
MCHC: 34.3 g/dL (ref 30.0–36.0)
MCV: 96.6 fL (ref 80.0–100.0)
Monocytes Absolute: 0.6 10*3/uL (ref 0.1–1.0)
Monocytes Relative: 8 %
Neutro Abs: 4.7 10*3/uL (ref 1.7–7.7)
Neutrophils Relative %: 71 %
Platelets: 196 10*3/uL (ref 150–400)
RBC: 4.71 MIL/uL (ref 3.87–5.11)
RDW: 11.5 % (ref 11.5–15.5)
WBC: 6.7 10*3/uL (ref 4.0–10.5)
nRBC: 0 % (ref 0.0–0.2)

## 2021-05-03 LAB — TROPONIN I (HIGH SENSITIVITY)
Troponin I (High Sensitivity): 7 ng/L (ref <18)
Troponin I (High Sensitivity): 5 ng/L (ref <18)

## 2021-05-03 LAB — CBG MONITORING, ED: Glucose-Capillary: 100 mg/dL — ABNORMAL HIGH (ref 70–99)

## 2021-05-03 NOTE — ED Notes (Signed)
CBG 100 

## 2021-05-03 NOTE — ED Notes (Signed)
Patient stepped outside for a moment.  

## 2021-05-03 NOTE — Discharge Instructions (Signed)
You have been seen and discharged from the emergency department.  Your heart work-up was normal.  The MRI of the brain was normal.  Follow-up with cardiology and your stress test next week.  Take home medications as prescribed. If you have any worsening symptoms or further concerns for your health please return to an emergency department for further evaluation.

## 2021-05-03 NOTE — Telephone Encounter (Signed)
Detailed instructions left on the patient's answering machine. Asked to call back with any questions. S.Zamarian Scarano EMTP 

## 2021-05-03 NOTE — ED Provider Notes (Signed)
Emergency Medicine Provider Triage Evaluation Note  Michelle Zamora , a 55 y.o. female  was evaluated in triage.  Pt complains of chest pain on the left side as well as paresthesias of her left upper extremity.  Symptoms began about an hour ago.  States that she was exercising when it happened.  She has been suffering from heart palpitations for the past few weeks heart monitor placed which she sent back a few days ago.  She is scheduled for stress test next week with her cardiologist.  States that the paresthesias are new for her.  Also reports some soreness in the left side of her neck.  Injuries or falls.  No fever.  Review of Systems  Positive: Paresthesias, chest pain Negative: Fever  Physical Exam  BP 119/69   Pulse (!) 57   Temp 98.5 F (36.9 C) (Oral)   Resp 12   SpO2 100%  Gen:   Awake, no distress   Resp:  Normal effort  MSK:   Moves extremities without difficulty  Other:  Strength 5/5 in bilateral upper and lower extremities.  Normal sensation to light touch of bilateral upper and lower extremities, face.  No facial asymmetry.  Symmetric tongue protrusion.  Pupils are equal and reactive to light.  Lungs are clear to auscultation bilaterally.  Heart rate appears normal  Medical Decision Making  Medically screening exam initiated at 7:48 AM.  Appropriate orders placed.  Michelle Zamora was informed that the remainder of the evaluation will be completed by another provider, this initial triage assessment does not replace that evaluation, and the importance of remaining in the ED until their evaluation is complete.  Workup initiated   Dietrich Pates, PA-C 05/03/21 1610    Alvira Monday, MD 05/03/21 2317

## 2021-05-03 NOTE — ED Triage Notes (Signed)
Patient complains of left arm numbness with radiation in to left jaw and chest tightness that started this morning while exercising, lasting approximately one hour. Numbness has improved since stopping exercise. Patient is scheduled for a stress test on 8/11 with cardiologist. Patient is currently alert, oriented, and in no apparent distress at this time.

## 2021-05-03 NOTE — ED Provider Notes (Signed)
[facility] EMERGENCY DEPARTMENT Provider Note   CSN: [id number] Arrival date & time: 05/03/21  8675     History Chief Complaint  Patient presents with   Chest Pain    Michelle Zamora is a 55 y.o. female.  HPI  55 year old female with past medical history of HLD, angina who currently follows with cardiology Dr. Eden Emms presents the emergency department with chest heaviness and left upper extremity/left jaw paresthesias.  Patient states earlier this morning when she was on the elliptical she developed the left-sided paresthesias.  For the past couple months she has had a persistent chest heaviness/tightness.  This is why she is being followed with cardiology.  She has a stress test scheduled for next week.  Patient states that when she had the paresthesia the chest heaviness felt slightly more severe.  She stopped her workout to see if this would improve her symptoms but the paresthesias persisted so she came to the emergency department.  She states the pins and needle sensation is improved however not resolved.  She points to her left forearm and her left jaw as to where the worst areas are.  Denies any injury or neck pain.  No other neurologic complaints.  No shortness of breath.  Past Medical History:  Diagnosis Date   Complication of anesthesia    Glaucoma    Macular hole    MVP (mitral valve prolapse)    was monitored by Dr Carney Harder- no need for follow up.   PONV (postoperative nausea and vomiting)     Patient Active Problem List   Diagnosis Date Noted   Hyperlipidemia 04/12/2021   Unstable angina (HCC) 04/12/2021   Palpitations 04/12/2021    Past Surgical History:  Procedure Laterality Date   25 GAUGE PARS PLANA VITRECTOMY WITH 20 GAUGE MVR PORT FOR MACULAR HOLE Left 11/24/2012   Procedure: 25 GAUGE PARS PLANA VITRECTOMY WITH 20 GAUGE MVR PORT FOR MACULAR HOLE;  Surgeon: Sherrie George, MD;  Location: [facility] OR;  Service: Ophthalmology;  Laterality: Left;    EYE SURGERY Left 2005   FINGER SURGERY Left 2003   repair Left ring finger.   GAS INSERTION Left 11/24/2012   Procedure: INSERTION OF GAS;  Surgeon: Sherrie George, MD;  Location: [facility] OR;  Service: Ophthalmology;  Laterality: Left;  C3F8   MEMBRANE PEEL Left 11/24/2012   Procedure: MEMBRANE PEEL;  Surgeon: Sherrie George, MD;  Location: [facility] OR;  Service: Ophthalmology;  Laterality: Left;   PHOTOCOAGULATION WITH LASER Left 11/24/2012   Procedure: PHOTOCOAGULATION WITH LASER;  Surgeon: Sherrie George, MD;  Location: [facility] OR;  Service: Ophthalmology;  Laterality: Left;  ENDOLASER   SERUM PATCH Left 11/24/2012   Procedure: SERUM PATCH;  Surgeon: Sherrie George, MD;  Location: [facility] OR;  Service: Ophthalmology;  Laterality: Left;   VARICOSE VEIN SURGERY Bilateral 2006   VITERECTOMY  11/24/2012   DR MATTHEWS     OB History   No obstetric history on file.     Family History  Problem Relation Age of Onset   Breast cancer Sister 78   CAD Mother     Social History   Tobacco Use   Smoking status: Never   Smokeless tobacco: Never  Substance Use Topics   Alcohol use: No   Drug use: No    Home Medications Prior to Admission medications   Medication Sig Start Date End Date Taking? Authorizing Provider  Calcium Carbonate-Vitamin D (CALCIUM 600 + D PO) Take 1 tablet  by mouth daily.   Yes [provider]  cetirizine (ZYRTEC) 10 MG tablet Take 1 tablet by mouth daily as needed for allergies.   Yes [provider]  fexofenadine (ALLEGRA) 180 MG tablet Take 1 tablet by mouth daily.   Yes [provider]  latanoprost (XALATAN) 0.005 % ophthalmic solution Place 1 drop into both eyes at bedtime. 06/20/20  Yes [provider]  Multiple Vitamin (MULTIVITAMIN WITH MINERALS) TABS Take 1 tablet by mouth daily.   Yes [provider]  rosuvastatin (CRESTOR) 10 MG tablet Take 1 tablet (10 mg total) by mouth daily. 10/10/20  Yes Nahser, Deloris Ping, MD  timolol (TIMOPTIC)  0.5 % ophthalmic solution Place 1 drop into both eyes 2 (two) times daily. 08/25/20  Yes [provider]    Allergies    Patient has no known allergies.  Review of Systems   Review of Systems  Constitutional:  Negative for chills and fever.  HENT:  Negative for congestion.   Eyes:  Negative for visual disturbance.  Respiratory:  Positive for chest tightness. Negative for shortness of breath.   Cardiovascular:  Negative for chest pain, palpitations and leg swelling.  Gastrointestinal:  Negative for abdominal pain, diarrhea and vomiting.  Genitourinary:  Negative for dysuria.  Skin:  Negative for rash.  Neurological:  Positive for numbness. Negative for facial asymmetry, speech difficulty, weakness, light-headedness and headaches.   Physical Exam Updated Vital Signs BP (!) 113/51   Pulse (!) 44   Temp 98.5 F (36.9 C) (Oral)   Resp 14   SpO2 100%   Physical Exam Vitals and nursing note reviewed.  Constitutional:      Appearance: Normal appearance.  HENT:     Head: Normocephalic.     Mouth/Throat:     Mouth: Mucous membranes are moist.  Cardiovascular:     Rate and Rhythm: Normal rate.  Pulmonary:     Effort: Pulmonary effort is normal. No respiratory distress.  Abdominal:     Palpations: Abdomen is soft.     Tenderness: There is no abdominal tenderness.  Skin:    General: Skin is warm.  Neurological:     Mental Status: She is alert and oriented to person, place, and time. Mental status is at baseline.     Comments: Equal sensory on neuro exam, no focal weakness, no ataxia  Psychiatric:        Mood and Affect: Mood normal.    ED Results / Procedures / Treatments   Labs (all labs ordered are listed, but only abnormal results are displayed) Labs Reviewed  BASIC METABOLIC PANEL - Abnormal; Notable for the following components:      Result Value   Glucose, Bld 101 (*)    All other components within normal limits  CBC WITH DIFFERENTIAL/PLATELET - Abnormal;  Notable for the following components:   Hemoglobin 15.6 (*)    All other components within normal limits  CBG MONITORING, ED - Abnormal; Notable for the following components:   Glucose-Capillary 100 (*)    All other components within normal limits  TROPONIN I (HIGH SENSITIVITY)  TROPONIN I (HIGH SENSITIVITY)    EKG EKG Interpretation  Date/Time:  Thursday May 03 2021 07:34:14 EDT Ventricular Rate:  54 PR Interval:  160 QRS Duration: 82 QT Interval:  412 QTC Calculation: 390 R Axis:   60 Text Interpretation: Sinus bradycardia Otherwise normal ECG Sinus bradycardia, similar to previous Confirmed by Coralee Pesa [phone]) on 05/03/2021 11:32:13 AM  Radiology No results  found.  Procedures Procedures   Medications Ordered in ED Medications - No data to display  ED Course  I have reviewed the triage vital signs and the nursing notes.  Pertinent labs & imaging results that were available during my care of the patient were reviewed by me and considered in my medical decision making (see chart for details).    MDM Rules/Calculators/A&P                           55 year old female presents the emergency department with concern for ongoing chest heaviness and left arm/jaw tingling sensation.  Patient is bradycardic on arrival which is baseline, otherwise vitals are stable.  Arrival EKG shows sinus bradycardia with no ischemic changes.  Patient states this chest heaviness has been persistent for months, she is currently being evaluated by cardiology as an outpatient for it, has a stress test next week.  Patient denies any headache or other acute neurologic complaint.  On sensory exam her sensory is equal and intact.  No focal deficit.  Blood work is reassuring, 2 negative troponins with only a delta of 2.  Chest x-ray is unremarkable.  MRI of the brain shows no findings of TIA/stroke.  Since her chest heaviness has been chronic and she has adequate outpatient follow-up I do not feel  she needs emergent admission/testing.  Low suspicion for dissection/PE given the chronicity.  Patient at this time appears safe and stable for discharge and will be treated as an outpatient.  Discharge plan and strict return to ED precautions discussed, patient verbalizes understanding and agreement.  Final Clinical Impression(s) / ED Diagnoses Final diagnoses:  None    Rx / DC Orders ED Discharge Orders     None        Rozelle Logan, DO 05/03/21 1605

## 2021-05-10 ENCOUNTER — Other Ambulatory Visit: Payer: Self-pay

## 2021-05-10 ENCOUNTER — Ambulatory Visit (HOSPITAL_COMMUNITY): Payer: 59 | Attending: Cardiology

## 2021-05-10 DIAGNOSIS — R072 Precordial pain: Secondary | ICD-10-CM | POA: Insufficient documentation

## 2021-05-10 LAB — MYOCARDIAL PERFUSION IMAGING
Estimated workload: 13.4 METS
Exercise duration (min): 12 min
Exercise duration (sec): 0 s
LV dias vol: 91 mL (ref 46–106)
LV sys vol: 40 mL
MPHR: 165 {beats}/min
Peak HR: 151 {beats}/min
Percent HR: 91 %
Rest HR: 47 {beats}/min
SDS: 0
SRS: 0
SSS: 0
TID: 0.82

## 2021-05-10 MED ORDER — TECHNETIUM TC 99M TETROFOSMIN IV KIT
30.9000 | PACK | Freq: Once | INTRAVENOUS | Status: AC | PRN
  Administered 2021-05-10: 30.9 via INTRAVENOUS
  Filled 2021-05-10: qty 31

## 2021-05-10 MED ORDER — TECHNETIUM TC 99M TETROFOSMIN IV KIT
10.8000 | PACK | Freq: Once | INTRAVENOUS | Status: AC | PRN
  Administered 2021-05-10: 10.8 via INTRAVENOUS
  Filled 2021-05-10: qty 11

## 2021-06-22 ENCOUNTER — Encounter: Payer: Self-pay | Admitting: Cardiovascular Disease

## 2021-06-22 ENCOUNTER — Ambulatory Visit: Payer: 59 | Admitting: Cardiovascular Disease

## 2021-06-22 ENCOUNTER — Other Ambulatory Visit: Payer: Self-pay

## 2021-06-22 VITALS — BP 112/62 | HR 63 | Ht 67.0 in | Wt 151.6 lb

## 2021-06-22 DIAGNOSIS — I200 Unstable angina: Secondary | ICD-10-CM | POA: Diagnosis not present

## 2021-06-22 DIAGNOSIS — I341 Nonrheumatic mitral (valve) prolapse: Secondary | ICD-10-CM | POA: Diagnosis not present

## 2021-06-22 DIAGNOSIS — E782 Mixed hyperlipidemia: Secondary | ICD-10-CM | POA: Diagnosis not present

## 2021-06-22 MED ORDER — METOPROLOL TARTRATE 50 MG PO TABS: 50.0000 mg | ORAL_TABLET | Freq: Once | ORAL | 0 refills | Status: DC

## 2021-06-22 NOTE — Progress Notes (Signed)
Cardiology Office Note:    Date:  06/22/2021   ID:  Michelle Zamora, DOB Nov 29, 1965, MRN [id number]  PCP:  [name], MD  [facility] Cardiologist:  Kristeen Miss, MD  [facility] Electrophysiologist:  None   Referring MD: [name], MD   No chief complaint on file.    Jan. 4, 2022   Michelle Zamora is a 55 y.o. female with a hx of hyperlipidemia.  We were asked to see her today for further management of her hyperliidemia by Dr. Lenise Arena.  I know Michelle Zamora from [facility] spin class .  She has continued to ride at home   Labs from her primary medical doctor reveal a total cholesterol of 222.  Her HDL is 72.  The triglyceride level is 66.  The LDL is 138.  I saw Michelle Zamora years ago ,  She has hx of MVP Mother has hx of CAD , CABG , died at age 56 Father was healthy,  Died in 2019/02/12 of COVID   She still rides several times a week  No CP when she rides .   April 12, 2021: Michelle Zamora is seen today for follow-up of her hyperlipidemia.  She has a family history of coronary artery disease with early cardiac death.  She also has a history of mitral valve prolapse.   Coronary calcium score of 52.5. This was 92nd percentile for age and sex matched control. She is on rosuvastatin 10 mg a day  Exercising regularly , no CP with exercise   Last Friday,  had 5 hard / fast heart beats . While at rest, in evening  Has felt a soreness. Sore front and back,  between shoulder blades  Has continued to exercise without any difficulty Chest and back are Still a little sore  No cough No covid symptoms Able to take a deep breath   That episode ended and did not cause any trouble but she has had a persistent chest soreness both on her chest and back since that time.  The pain is not related to eating, drinking, change of position, taking a deep breath.  There is no rib tenderness.  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   Sept. 23, 2022:  Michelle Zamora is seen for follow up of her  chest pain and HLD  Chest tightness , radiation to the left arm   CP Is related to exercise  She went to the emergency room for further evaluation. Troponin levels were negative.  MRI of the brain was negative for TIA.   EKG was unremarkable.  No evidence of pericarditis.  Cycles , strength training      Past Medical History:  Diagnosis Date   Complication of anesthesia    Glaucoma    Macular hole    MVP (mitral valve prolapse)    was monitored by Dr Carney Harder- no need for follow up.   PONV (postoperative nausea and vomiting)     Past Surgical History:  Procedure Laterality Date   25 GAUGE PARS PLANA VITRECTOMY WITH 20 GAUGE MVR PORT FOR MACULAR HOLE Left 11/24/2012   Procedure: 25 GAUGE PARS PLANA VITRECTOMY WITH 20 GAUGE MVR PORT FOR MACULAR HOLE;  Surgeon: Sherrie George, MD;  Location: [facility] OR;  Service: Ophthalmology;  Laterality: Left;   EYE SURGERY Left 2004/02/12   FINGER SURGERY Left 11-Feb-2002   repair Left ring finger.   GAS INSERTION Left 11/24/2012   Procedure: INSERTION OF GAS;  Surgeon: Beulah Gandy  Ashley Royalty, MD;  Location: [facility] OR;  Service: Ophthalmology;  Laterality: Left;  C3F8   MEMBRANE PEEL Left 11/24/2012   Procedure: MEMBRANE PEEL;  Surgeon: Sherrie George, MD;  Location: [facility] OR;  Service: Ophthalmology;  Laterality: Left;   PHOTOCOAGULATION WITH LASER Left 11/24/2012   Procedure: PHOTOCOAGULATION WITH LASER;  Surgeon: Sherrie George, MD;  Location[facility] OR;  Service: Ophthalmology;  Laterality: Left;  ENDOLASER   SERUM PATCH Left 11/24/2012   Procedure: SERUM PATCH;  Surgeon: Sherrie George, MD;  Location: [facility] OR;  Service: Ophthalmology;  Laterality: Left;   VARICOSE VEIN SURGERY Bilateral 2006   VITERECTOMY  11/24/2012   DR MATTHEWS    Current Medications: Current Meds  Medication Sig   Calcium Carbonate-Vitamin D (CALCIUM 600 + D PO) Take 1 tablet by mouth daily.   cetirizine (ZYRTEC) 10 MG tablet Take 1 tablet by mouth daily as needed for allergies.   fexofenadine  (ALLEGRA) 180 MG tablet Take 1 tablet by mouth daily.   latanoprost (XALATAN) 0.005 % ophthalmic solution Place 1 drop into both eyes at bedtime.   metoprolol tartrate (LOPRESSOR) 50 MG tablet Take 1 tablet (50 mg total) by mouth once for 1 dose. Take 1 pill 2 hours before your coronary CT   Multiple Vitamin (MULTIVITAMIN WITH MINERALS) TABS Take 1 tablet by mouth daily.   timolol (TIMOPTIC) 0.5 % ophthalmic solution Place 1 drop into both eyes 2 (two) times daily.     Allergies:   Patient has no known allergies.   Social History   Socioeconomic History   Marital status: Married    Spouse name: Not on file   Number of children: Not on file   Years of education: Not on file   Highest education level: Not on file  Occupational History   Not on file  Tobacco Use   Smoking status: Never   Smokeless tobacco: Never  Substance and Sexual Activity   Alcohol use: No   Drug use: No   Sexual activity: Not on file  Other Topics Concern   Not on file  Social History Narrative   Not on file   Social Determinants of Health   Financial Resource Strain: Not on file  Food Insecurity: Not on file  Transportation Needs: Not on file  Physical Activity: Not on file  Stress: Not on file  Social Connections: Not on file     Family History: The patient's family history includes Breast cancer (age of onset: 74) in her sister; CAD in her mother.  ROS:   Please see the history of present illness.     All other systems reviewed and are negative.  EKGs/Labs/Other Studies Reviewed:    The following studies were reviewed today:   EKG:      Recent Labs: 01/01/2021: ALT 15 05/03/2021: BUN 18; Creatinine, Ser 0.95; Hemoglobin 15.6; Platelets 196; Potassium 4.4; Sodium 137  Recent Lipid Panel    Component Value Date/Time   CHOL 133 01/01/2021 0732   TRIG 60 01/01/2021 0732   HDL 61 01/01/2021 0732   CHOLHDL 2.2 01/01/2021 0732   LDLCALC 59 01/01/2021 0732     Risk  Assessment/Calculations:       Physical Exam:    Physical Exam: Blood pressure 112/62, pulse 63, height 5\' 7"  (1.702 m), weight 151 lb 9.6 oz (68.8 kg), last menstrual period 01/29/2018, SpO2 97 %.  GEN:  Well nourished, well developed in no acute distress HEENT: Normal NECK: No JVD; No carotid bruits LYMPHATICS:  No lymphadenopathy CARDIAC: RRR , mid systolic click.  No significant MV murmur  RESPIRATORY:  Clear to auscultation without rales, wheezing or rhonchi  ABDOMEN: Soft, non-tender, non-distended MUSCULOSKELETAL:  No edema; No deformity  SKIN: Warm and dry NEUROLOGIC:  Alert and oriented x 3    ASSESSMENT:    1. Unstable angina pectoris (HCC)   2. Mitral valve prolapse     PLAN:      1.  Chest discomfort: She continues to have exercise-induced chest pain.  Her last episode involved intense chest pressure with radiation to the left arm.  It occurred while she was cycling.  She went up to the emergency room.  Troponins were negative.   She was also having some numbness in her arm and a CT of the brain was negative for TIA.  Would like to do a coronary CT angiogram for further evaluation of this chest pain.  She has a strong family history of premature coronary artery disease  Hyperlipidemia: She is not tolerating the rosuvastatin very well at all.  She having lots of muscle aches.  Would have a low threshold to refer her to the lipid clinic for consideration of a PCSK9 inhibitor.  We will wait and see with the coronary CT angiogram shows.   2.  Mitral valve prolapse: Has mitral valve click on exam.  No significant mitral regurgitation.  She was found to have MVP by exam and possiibly on previous echo .  I would like to repeat her echocardiogram for further evaluation of her mitral valve, LV function and pericardium        Medication Adjustments/Labs and Tests Ordered: Current medicines are reviewed at length with the patient today.  Concerns regarding medicines are  outlined above.  Orders Placed This Encounter  Procedures   CT CORONARY MORPH W/CTA COR W/SCORE W/CA W/CM &/OR WO/CM   ECHOCARDIOGRAM COMPLETE    Meds ordered this encounter  Medications   metoprolol tartrate (LOPRESSOR) 50 MG tablet    Sig: Take 1 tablet (50 mg total) by mouth once for 1 dose. Take 1 pill 2 hours before your coronary CT    Dispense:  1 tablet    Refill:  0    Order Specific Question:   Supervising Provider    Answer:   Vesta Mixer [phone]     Patient Instructions  Medication Instructions:  Your physician recommends that you continue on your current medications as directed. Please refer to the Current Medication list given to you today.  *If you need a refill on your cardiac medications before your next appointment, please call your pharmacy*   Lab Work: None Ordered If you have labs (blood work) drawn today and your tests are completely normal, you will receive your results only by: MyChart Message (if you have MyChart) OR A paper copy in the mail If you have any lab test that is abnormal or we need to change your treatment, we will call you to review the results.   Testing/Procedures: Your physician has requested that you have an echocardiogram. Echocardiography is a painless test that uses sound waves to create images of your heart. It provides your doctor with information about the size and shape of your heart and how well your heart's chambers and valves are working. This procedure takes approximately one hour. There are no restrictions for this procedure.    Your cardiac CT will be scheduled at one of the below locations:   [facility] [address]  [address] [place], [place] [address] [phone]  OR  [facility] [address] [place], [place] [address] [phone]  If scheduled at [facility], please arrive at the [facility] main entrance (entrance A) of [facility]  30 minutes prior to test start time. Proceed to the [facility] Radiology Department (first floor) to check-in and test prep.  If scheduled at [facility], please arrive 15 mins early for check-in and test prep.  Please follow these instructions carefully (unless otherwise directed):   On the Night Before the Test: Be sure to Drink plenty of water. Do not consume any caffeinated/decaffeinated beverages or chocolate 12 hours prior to your test. Do not take any antihistamines 12 hours prior to your test.   On the Day of the Test: Drink plenty of water until 1 hour prior to the test. Do not eat any food 4 hours prior to the test. You may take your regular medications prior to the test.  Take metoprolol (Lopressor) two hours prior to test. FEMALES- please wear underwire-free bra if available, avoid dresses & tight clothing        After the Test: Drink plenty of water. After receiving IV contrast, you may experience a mild flushed feeling. This is normal. On occasion, you may experience a mild rash up to 24 hours after the test. This is not dangerous. If this occurs, you can take Benadryl 25 mg and increase your fluid intake. If you experience trouble breathing, this can be serious. If it is severe call 911 IMMEDIATELY. If it is mild, please call our office. If you take any of these medications: Glipizide/Metformin, Avandament, Glucavance, please do not take 48 hours after completing test unless otherwise instructed.  Please allow 2-4 weeks for scheduling of routine cardiac CTs. Some insurance companies require a pre-authorization which may delay scheduling of this test.   For non-scheduling related questions, please contact the cardiac imaging nurse navigator should you have any questions/concerns: Rockwell Alexandria, Cardiac Imaging Nurse Navigator Larey Brick, Cardiac Imaging Nurse Navigator [facility] Direct Office Dial:  [phone]   For scheduling needs, including cancellations and rescheduling, please call [place], [phone].    Follow-Up: At [facility], you and your health needs are our priority.  As part of our continuing mission to provide you with exceptional heart care, we have created designated Provider Care Teams.  These Care Teams include your primary Cardiologist (physician) and Advanced Practice Providers (APPs -  Physician Assistants and Nurse Practitioners) who all work together to provide you with the care you need, when you need it.    Your next appointment:   3 month(s)  The format for your next appointment:   In Person  Provider:   Kristeen Miss, MD     Signed, Kristeen Miss, MD  06/22/2021 4:51 PM    [facility]

## 2021-06-22 NOTE — Patient Instructions (Addendum)
Medication Instructions:  Your physician recommends that you continue on your current medications as directed. Please refer to the Current Medication list given to you today.  *If you need a refill on your cardiac medications before your next appointment, please call your pharmacy*   Lab Work: None Ordered If you have labs (blood work) drawn today and your tests are completely normal, you will receive your results only by: MyChart Message (if you have MyChart) OR A paper copy in the mail If you have any lab test that is abnormal or we need to change your treatment, we will call you to review the results.   Testing/Procedures: Your physician has requested that you have an echocardiogram. Echocardiography is a painless test that uses sound waves to create images of your heart. It provides your doctor with information about the size and shape of your heart and how well your heart's chambers and valves are working. This procedure takes approximately one hour. There are no restrictions for this procedure.    Your cardiac CT will be scheduled at one of the below locations:   [facility] [address] [place], [place] [address] [phone]  OR  [facility] [address] [place], [place] [address] [phone]  If scheduled at [facility], please arrive at the [facility] main entrance (entrance A) of [facility] 30 minutes prior to test start time. Proceed to the [facility] Radiology Department (first floor) to check-in and test prep.  If scheduled at [facility], please arrive 15 mins early for check-in and test prep.  Please follow these instructions carefully (unless otherwise directed):   On the Night Before the Test: Be sure to Drink plenty of water. Do not consume any caffeinated/decaffeinated beverages or chocolate 12 hours prior to your test. Do not take any  antihistamines 12 hours prior to your test.   On the Day of the Test: Drink plenty of water until 1 hour prior to the test. Do not eat any food 4 hours prior to the test. You may take your regular medications prior to the test.  Take metoprolol (Lopressor) two hours prior to test. FEMALES- please wear underwire-free bra if available, avoid dresses & tight clothing        After the Test: Drink plenty of water. After receiving IV contrast, you may experience a mild flushed feeling. This is normal. On occasion, you may experience a mild rash up to 24 hours after the test. This is not dangerous. If this occurs, you can take Benadryl 25 mg and increase your fluid intake. If you experience trouble breathing, this can be serious. If it is severe call 911 IMMEDIATELY. If it is mild, please call our office. If you take any of these medications: Glipizide/Metformin, Avandament, Glucavance, please do not take 48 hours after completing test unless otherwise instructed.  Please allow 2-4 weeks for scheduling of routine cardiac CTs. Some insurance companies require a pre-authorization which may delay scheduling of this test.   For non-scheduling related questions, please contact the cardiac imaging nurse navigator should you have any questions/concerns: Rockwell Alexandria, Cardiac Imaging Nurse Navigator Larey Brick, Cardiac Imaging Nurse Navigator [place] [facility] Direct Office Dial: [phone]   For scheduling needs, including cancellations and rescheduling, please call [place], [phone].    Follow-Up: At [facility], you and your health needs are our priority.  As part of our continuing mission to provide you with  exceptional heart care, we have created designated Provider Care Teams.  These Care Teams include your primary Cardiologist (physician) and Advanced Practice Providers (APPs -  Physician Assistants and Nurse Practitioners) who all work together to provide  you with the care you need, when you need it.    Your next appointment:   3 month(s)  The format for your next appointment:   In Person  Provider:   Kristeen Miss, MD

## 2021-06-28 ENCOUNTER — Telehealth (HOSPITAL_COMMUNITY): Payer: Self-pay | Admitting: *Deleted

## 2021-06-28 NOTE — Telephone Encounter (Signed)
Reaching out to patient to offer assistance regarding upcoming cardiac imaging study; pt verbalizes understanding of appt date/time, parking situation and where to check in, pre-test NPO status and medications ordered, and verified current allergies; name and call back number provided for further questions should they arise  Enola Siebers RN Navigator Cardiac Imaging [facility] [phone] office [phone] cell  Patient to take 50mg metoprolol tartrate two hours prior to cardiac CT scan. 

## 2021-07-02 ENCOUNTER — Other Ambulatory Visit: Payer: Self-pay

## 2021-07-02 ENCOUNTER — Ambulatory Visit (HOSPITAL_COMMUNITY)
Admission: RE | Admit: 2021-07-02 | Discharge: 2021-07-02 | Disposition: A | Discharge status: Home / Self Care | Payer: 59 | Source: Ambulatory Visit | Attending: Cardiovascular Disease | Admitting: Cardiovascular Disease

## 2021-07-02 DIAGNOSIS — I200 Unstable angina: Secondary | ICD-10-CM

## 2021-07-02 MED ORDER — NITROGLYCERIN 0.4 MG SL SUBL
0.8000 mg | SUBLINGUAL_TABLET | Freq: Once | SUBLINGUAL | Status: AC
  Administered 2021-07-02: 0.8 mg via SUBLINGUAL

## 2021-07-02 MED ORDER — NITROGLYCERIN 0.4 MG SL SUBL
SUBLINGUAL_TABLET | SUBLINGUAL | Status: AC
  Filled 2021-07-02: qty 2

## 2021-07-02 MED ORDER — IOHEXOL 350 MG/ML SOLN
95.0000 mL | Freq: Once | INTRAVENOUS | Status: AC | PRN
  Administered 2021-07-02: 95 mL via INTRAVENOUS

## 2021-07-03 ENCOUNTER — Ambulatory Visit: Payer: 59 | Admitting: Cardiovascular Disease

## 2021-07-04 NOTE — Progress Notes (Signed)
Patient ID: Michelle Zamora                 DOB: 1966-01-12                    MRN: [id number]     HPI: Michelle Zamora is a 55 y.o. female patient referred to lipid clinic by Dr Elease Hashimoto. PMH is significant for HLD, mitral valve prolapse, and chest pain with exercise with workup negative for cardiac cause. She underwent coronary CTA 07/02/21 which showed calcium score of 42.9 (89th percentile) and mild ostial LAD stenosis. Previous calcium score 10/09/20 was mildly higher at 52.5 (92nd percentile for age and sex matched control).  Pt presents today in good spirits. She was previously prescribed lovastatin by her PCP but didn't take it. First started rosuvastatin in January 2022. Noticed headaches and nausea by the summer. She stopped taking her rosuvastatin and her symptoms resolved within 2 weeks. Has not tried other lipid lowering therapy. Tries to eat a heart healthy diet, stays active with spin class.  Current Medications: none Intolerances: rosuvastatin 10mg  daily - muscle aches Risk Factors: elevated calcium score, FHx of CAD LDL goal: 70mg /dL  Diet: Breakfast - fruit, PB, yogurt. Lunch - leftovers. Dinner - burger, [place]  Exercise: [employer] spin class  Family History: Mother with CAD and CABG, died at 18.  Social History: Denies tobacco, alcohol, and drug use.  Labs: 01/01/21: TC 133, TG 60, HDL 61, LDL 59 (rosuvastatin 10mg  daily)  07/24/20: TC 222, HDL 72, TG 66, LDL 138, nonHDL 149   Past Medical History:  Diagnosis Date   Complication of anesthesia    Glaucoma    Macular hole    MVP (mitral valve prolapse)    was monitored by Dr - no need for follow up.   PONV (postoperative nausea and vomiting)     Current Outpatient Medications on File Prior to Visit  Medication Sig Dispense Refill   Calcium Carbonate-Vitamin D (CALCIUM 600 + D PO) Take 1 tablet by mouth daily.     cetirizine (ZYRTEC) 10 MG tablet Take 1 tablet by mouth daily as needed for allergies.      fexofenadine (ALLEGRA) 180 MG tablet Take 1 tablet by mouth daily.     latanoprost (XALATAN) 0.005 % ophthalmic solution Place 1 drop into both eyes at bedtime.     metoprolol tartrate (LOPRESSOR) 50 MG tablet Take 1 tablet (50 mg total) by mouth once for 1 dose. Take 1 pill 2 hours before your coronary CT 1 tablet 0   Multiple Vitamin (MULTIVITAMIN WITH MINERALS) TABS Take 1 tablet by mouth daily.     rosuvastatin (CRESTOR) 10 MG tablet Take 1 tablet (10 mg total) by mouth daily. (Patient not taking: Reported on 06/22/2021) 90 tablet 3   timolol (TIMOPTIC) 0.5 % ophthalmic solution Place 1 drop into both eyes 2 (two) times daily.     No current facility-administered medications on file prior to visit.    No Known Allergies  Assessment/Plan:  1. Hyperlipidemia - Baseline LDL 138 above goal < 70 due to elevated calcium score and FHx of CAD. LDL previously at goal on rosuvastatin 10mg  daily however pt did not tolerate secondary to nausea and headache. Will rechallenge with comparable dose of atorvastatin 20mg  daily. She sees her PCP in about a month and will have them recheck her lipids. Advised her to call clinic with any tolerability issues, can trial pravastatin if so.  Majel Giel E. Tamyra Fojtik, PharmD,  BCACP, CPP [facility] [address], [place], [place] [address] Phone: [phone]; Fax: [phone] 07/05/2021 8:41 AM

## 2021-07-05 ENCOUNTER — Ambulatory Visit: Payer: 59 | Admitting: Pharmacist

## 2021-07-05 ENCOUNTER — Ambulatory Visit (HOSPITAL_COMMUNITY): Payer: 59 | Attending: Cardiology

## 2021-07-05 ENCOUNTER — Other Ambulatory Visit: Payer: Self-pay

## 2021-07-05 DIAGNOSIS — I341 Nonrheumatic mitral (valve) prolapse: Secondary | ICD-10-CM | POA: Insufficient documentation

## 2021-07-05 DIAGNOSIS — E782 Mixed hyperlipidemia: Secondary | ICD-10-CM

## 2021-07-05 DIAGNOSIS — R931 Abnormal findings on diagnostic imaging of heart and coronary circulation: Secondary | ICD-10-CM

## 2021-07-05 LAB — ECHOCARDIOGRAM COMPLETE
Area-P 1/2: 1.43 cm2
S' Lateral: 3.4 cm

## 2021-07-05 MED ORDER — ATORVASTATIN CALCIUM 20 MG PO TABS: 20.0000 mg | ORAL_TABLET | Freq: Every day | ORAL | 11 refills | Status: DC

## 2021-07-05 NOTE — Addendum Note (Signed)
Addended by: Greely Atiyeh E on: 07/05/2021 08:43 AM   Modules accepted: Orders

## 2021-07-05 NOTE — Patient Instructions (Signed)
Your LDL goal is < 70. Your baseline was 138 which improved to 59 on rosuvastatin  Start taking atorvastatin (Lipitor) 20mg  - 1 tablet once daily  Call , PharmD at #[phone] with any concerns  Have your primary care doctor recheck fasting labs when you see them next month - lipid panel and LFTs

## 2021-07-20 ENCOUNTER — Other Ambulatory Visit: Payer: Self-pay

## 2021-07-20 ENCOUNTER — Encounter (INDEPENDENT_AMBULATORY_CARE_PROVIDER_SITE_OTHER): Payer: 59 | Admitting: Ophthalmology

## 2021-07-20 DIAGNOSIS — H35342 Macular cyst, hole, or pseudohole, left eye: Secondary | ICD-10-CM | POA: Diagnosis not present

## 2021-07-20 DIAGNOSIS — H43812 Vitreous degeneration, left eye: Secondary | ICD-10-CM | POA: Diagnosis not present

## 2021-07-20 DIAGNOSIS — H33301 Unspecified retinal break, right eye: Secondary | ICD-10-CM

## 2021-07-25 ENCOUNTER — Other Ambulatory Visit: Payer: Self-pay

## 2021-07-25 ENCOUNTER — Ambulatory Visit: Payer: 59 | Admitting: Podiatry

## 2021-07-25 DIAGNOSIS — L600 Ingrowing nail: Secondary | ICD-10-CM

## 2021-07-25 MED ORDER — GENTAMICIN SULFATE 0.1 % EX CREA: 1.0000 "application " | TOPICAL_CREAM | Freq: Two times a day (BID) | CUTANEOUS | 1 refills | Status: DC

## 2021-07-25 NOTE — Progress Notes (Signed)
   Subjective: Patient presents today for evaluation of pain to the medial border right great toe. Patient is concerned for possible ingrown nail.  It is very sensitive to touch.  Patient states that she has had ingrown's for several years and she has routine pedicures to help alleviate the symptoms.  She says the ingrown's continue to recur patient presents today for further treatment and evaluation.  Past Medical History:  Diagnosis Date   Complication of anesthesia    Glaucoma    Macular hole    MVP (mitral valve prolapse)    was monitored by Dr Carney Harder- no need for follow up.   PONV (postoperative nausea and vomiting)     Objective:  General: Well developed, nourished, in no acute distress, alert and oriented x3   Dermatology: Skin is warm, dry and supple bilateral.  Medial border right great toe appears to be erythematous with evidence of an ingrowing nail. Pain on palpation noted to the border of the nail fold. The remaining nails appear unremarkable at this time. There are no open sores, lesions.  Vascular: Dorsalis Pedis artery and Posterior Tibial artery pedal pulses palpable. No lower extremity edema noted.   Neruologic: Grossly intact via light touch bilateral.  Musculoskeletal: Muscular strength within normal limits in all groups bilateral. Normal range of motion noted to all pedal and ankle joints.   Assesement: #1 Paronychia with ingrowing nail medial border right great toe #2  Dystrophic toenail left second digit secondary to history of microtrauma trauma  Plan of Care:  1. Patient evaluated.  2. Discussed treatment alternatives and plan of care. Explained nail avulsion procedure and post procedure course to patient. 3. Patient opted for permanent partial nail avulsion of the ingrown portion of the nail.  4. Prior to procedure, local anesthesia infiltration utilized using 3 ml of a 50:50 mixture of 2% plain lidocaine and 0.5% plain marcaine in a normal hallux block  fashion and a betadine prep performed.  5. Partial permanent nail avulsion with chemical matrixectomy performed using 3x30sec applications of phenol followed by alcohol flush.  6. Light dressing applied.  Post care instructions provided 7.  Prescription for gentamicin 2% cream  8.  Return to clinic 2 weeks.  *Front office for [facility] (not a part of [facility])  Felecia Shelling, DPM [facility]  Dr. Felecia Shelling, DPM    [address] [place], [place] [address]                Office [phone]  Fax [phone]

## 2021-07-25 NOTE — Patient Instructions (Signed)

## 2021-08-08 ENCOUNTER — Ambulatory Visit (INDEPENDENT_AMBULATORY_CARE_PROVIDER_SITE_OTHER): Payer: 59 | Admitting: Podiatry

## 2021-08-08 ENCOUNTER — Other Ambulatory Visit: Payer: Self-pay

## 2021-08-08 DIAGNOSIS — L600 Ingrowing nail: Secondary | ICD-10-CM | POA: Diagnosis not present

## 2021-08-08 NOTE — Progress Notes (Signed)
   Subjective: 55 y.o. female presents today status post permanent nail avulsion procedure of the medial border right great toe that was performed on 07/25/2021.  Patient states that she is doing very well.  She has been soaking her foot and applying the antibiotic cream as instructed.  No new complaints.  Past Medical History:  Diagnosis Date   Complication of anesthesia    Glaucoma    Macular hole    MVP (mitral valve prolapse)    was monitored by Dr Carney Harder- no need for follow up.   PONV (postoperative nausea and vomiting)     Objective: Skin is warm, dry and supple. Nail and respective nail fold appears to be healing appropriately. Open wound to the associated nail fold with a granular wound base and moderate amount of fibrotic tissue. Minimal drainage noted. Mild erythema around the periungual region likely due to phenol chemical matricectomy.  Assessment: #1 s/p partial permanent nail matrixectomy medial border right great toe   Plan of care: #1 patient was evaluated  #2 light debridement of open wound was performed to the periungual border of the respective toe using a currette. Antibiotic ointment and Band-Aid was applied. #3 patient is to return to clinic on a PRN basis.  *Front office for [facility] (not a part of [facility])   Felecia Shelling, DPM [facility]  Dr. Felecia Shelling, DPM    [address] [place], [place] [address]                Office [phone]  Fax [phone]

## 2021-08-19 ENCOUNTER — Encounter: Payer: Self-pay | Admitting: Pharmacist

## 2021-08-20 ENCOUNTER — Other Ambulatory Visit: Payer: Self-pay | Admitting: Pharmacist

## 2021-08-20 MED ORDER — ATORVASTATIN CALCIUM 20 MG PO TABS: 20.0000 mg | ORAL_TABLET | Freq: Every day | ORAL | 3 refills | Status: DC

## 2021-09-11 ENCOUNTER — Encounter: Payer: Self-pay | Admitting: Cardiovascular Disease

## 2021-09-11 NOTE — Progress Notes (Signed)
Cardiology Office Note:    Date:  09/12/2021   ID:  Michelle Zamora, DOB 03-Dec-1965, MRN [id number]  PCP:  Orpah Melter, MD  [facility] Cardiologist:  Mertie Moores, MD  [place] Electrophysiologist:  None   Referring MD: Orpah Melter, MD   Chief Complaint  Patient presents with   Chest Pain   Hyperlipidemia      Jan. 4, 2022   Michelle Zamora is a 55 y.o. female with a hx of hyperlipidemia.  We were asked to see her today for further management of her hyperliidemia by Dr. Olen Pel.  I know Michelle Zamora from [facility] spin class .  She has continued to ride at home   Labs from her primary medical doctor reveal a total cholesterol of 222.  Her HDL is 72.  The triglyceride level is 66.  The LDL is 138.  I saw Michelle Zamora years ago ,  She has hx of MVP Mother has hx of CAD , CABG , died at age 50 Father was healthy,  Died in January 24, 2019 of COVID   She still rides several times a week  No CP when she rides .   April 12, 2021: Michelle Zamora is seen today for follow-up of her hyperlipidemia.  She has a family history of coronary artery disease with early cardiac death.  She also has a history of mitral valve prolapse.   Coronary calcium score of 52.5. This was 92nd percentile for age and sex matched control. She is on rosuvastatin 10 mg a day  Exercising regularly , no CP with exercise   Last Friday,  had 5 hard / fast heart beats . While at rest, in evening  Has felt a soreness. Sore front and back,  between shoulder blades  Has continued to exercise without any difficulty Chest and back are Still a little sore  No cough No covid symptoms Able to take a deep breath   That episode ended and did not cause any trouble but she has had a persistent chest soreness both on her chest and back since that time.  The pain is not related to eating, drinking, change of position, taking a deep breath.  There is no rib tenderness.  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   Sept.  23, 2022:  Michelle Zamora is seen for follow up of her chest pain and HLD  Chest tightness , radiation to the left arm   CP Is related to exercise  She went to the emergency room for further evaluation. Troponin levels were negative.  MRI of the brain was negative for TIA.   EKG was unremarkable.  No evidence of pericarditis.  Cycles , strength training    Dec. 14, 2022: Michelle Zamora is seen today for follow up of her chest pain , HLD, MVP Echo from Oct. 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  Mild MR.  Coronary artery calcium score 42.9 Agatston units. This places the patient in the 89th percentile, LAD - mild  ostial calcified plaque ( < 50% stenosis)  LCx - no CAD RCA - no CAD   Lipids from 2021-01-23 showed LDL = 59 We tried crestor,  she had muscle aches, nausea, head aches .On Atorvastatin 20 mg a day -  Had labs in Oct with her primary md Systems analyst)  Given her family hx of CAd, I would like her LDL to be around 50   No recent CP  Was diagnosed with anxiety,  was started on Lexapro Seems to be much better   Past Medical History:  Diagnosis Date   Complication of anesthesia    Glaucoma    Macular hole    MVP (mitral valve prolapse)    was monitored by Dr Carney Harder- no need for follow up.   PONV (postoperative nausea and vomiting)     Past Surgical History:  Procedure Laterality Date   25 GAUGE PARS PLANA VITRECTOMY WITH 20 GAUGE MVR PORT FOR MACULAR HOLE Left 11/24/2012   Procedure: 25 GAUGE PARS PLANA VITRECTOMY WITH 20 GAUGE MVR PORT FOR MACULAR HOLE;  Surgeon: Sherrie George, MD;  Location: [facility] OR;  Service: Ophthalmology;  Laterality: Left;   EYE SURGERY Left 2005   FINGER SURGERY Left 2003   repair Left ring finger.   GAS INSERTION Left 11/24/2012   Procedure: INSERTION OF GAS;  Surgeon: Sherrie George, MD;  Location: [facility] OR;  Service: Ophthalmology;  Laterality: Left;  C3F8   MEMBRANE PEEL Left 11/24/2012   Procedure: MEMBRANE PEEL;   Surgeon: Sherrie George, MD;  Location: [facility] OR;  Service: Ophthalmology;  Laterality: Left;   PHOTOCOAGULATION WITH LASER Left 11/24/2012   Procedure: PHOTOCOAGULATION WITH LASER;  Surgeon: Sherrie George, MD;  Location: [facility] OR;  Service: Ophthalmology;  Laterality: Left;  ENDOLASER   SERUM PATCH Left 11/24/2012   Procedure: SERUM PATCH;  Surgeon: Sherrie George, MD;  Location: [facility] OR;  Service: Ophthalmology;  Laterality: Left;   VARICOSE VEIN SURGERY Bilateral 2006   VITERECTOMY  11/24/2012   DR MATTHEWS    Current Medications: Current Meds  Medication Sig   atorvastatin (LIPITOR) 20 MG tablet Take 1 tablet (20 mg total) by mouth daily.   Calcium Carbonate-Vitamin D (CALCIUM 600 + D PO) Take 1 tablet by mouth daily.   cetirizine (ZYRTEC) 10 MG tablet Take 1 tablet by mouth daily as needed for allergies.   escitalopram (LEXAPRO) 10 MG tablet Take 10 mg by mouth daily.   fexofenadine (ALLEGRA) 180 MG tablet Take 1 tablet by mouth daily.   latanoprost (XALATAN) 0.005 % ophthalmic solution Place 1 drop into both eyes at bedtime.   Multiple Vitamin (MULTIVITAMIN WITH MINERALS) TABS Take 1 tablet by mouth daily.   timolol (TIMOPTIC) 0.5 % ophthalmic solution Place 1 drop into both eyes 2 (two) times daily.     Allergies:   Rosuvastatin   Social History   Socioeconomic History   Marital status: Married    Spouse name: Not on file   Number of children: Not on file   Years of education: Not on file   Highest education level: Not on file  Occupational History   Not on file  Tobacco Use   Smoking status: Never   Smokeless tobacco: Never  Substance and Sexual Activity   Alcohol use: No   Drug use: No   Sexual activity: Not on file  Other Topics Concern   Not on file  Social History Narrative   Not on file   Social Determinants of Health   Financial Resource Strain: Not on file  Food Insecurity: Not on file  Transportation Needs: Not on file  Physical Activity: Not on file   Stress: Not on file  Social Connections: Not on file     Family History: The patient's family history includes Breast cancer (age of onset: 54) in her sister; CAD in her mother.  ROS:   Please see the history of present illness.  All other systems reviewed and are negative.  EKGs/Labs/Other Studies Reviewed:        EKG:      Recent Labs: 01/01/2021: ALT 15 05/03/2021: BUN 18; Creatinine, Ser 0.95; Hemoglobin 15.6; Platelets 196; Potassium 4.4; Sodium 137  Recent Lipid Panel    Component Value Date/Time   CHOL 133 01/01/2021 0732   TRIG 60 01/01/2021 0732   HDL 61 01/01/2021 0732   CHOLHDL 2.2 01/01/2021 0732   LDLCALC 59 01/01/2021 0732     Risk Assessment/Calculations:       Physical Exam:     Physical Exam: Blood pressure 116/68, pulse (!) 54, height 5\' 7"  (1.702 m), weight 152 lb (68.9 kg), last menstrual period 01/29/2018, SpO2 97 %.  GEN:  Well nourished, well developed in no acute distress HEENT: Normal NECK: No JVD; No carotid bruits LYMPHATICS: No lymphadenopathy CARDIAC: RRR , soft mid systolic click  RESPIRATORY:  Clear to auscultation without rales, wheezing or rhonchi  ABDOMEN: Soft, non-tender, non-distended MUSCULOSKELETAL:  No edema; No deformity  SKIN: Warm and dry NEUROLOGIC:  Alert and oriented x 3     ASSESSMENT:    1. Mixed hyperlipidemia   2. Mitral valve prolapse   3. Elevated coronary artery calcium score      PLAN:      1.  Chest discomfort:  likely due to anxiety.   No obstructive CAD .    Hyperlipidemia: She has a strong family history of premature coronary artery disease.  She has an ostial LAD stenosis that is not significant at this time.  I would like for her LDL to be around 50 or perhaps lower.  She is currently on atorvastatin.  She did not tolerate rosuvastatin.  We will discuss with our lipid clinic about the possibility of her starting an additional agent.  We could consider acetamide, PCSK9 inhibitor,  Inclisarin,  She is very careful with her diet and exercises regularly     2.  Mitral valve prolapse:  Stable ,  mild MR         Medication Adjustments/Labs and Tests Ordered: Current medicines are reviewed at length with the patient today.  Concerns regarding medicines are outlined above.  Orders Placed This Encounter  Procedures   Basic metabolic panel   ALT   Lipid Profile     No orders of the defined types were placed in this encounter.    Patient Instructions  Medication Instructions:   Your physician recommends that you continue on your current medications as directed. Please refer to the Current Medication list given to you today.  *If you need a refill on your cardiac medications before your next appointment, please call your pharmacy*   Lab Work:  IN ONE YEAR HERE IN THE OFFICE--YOU MAY HAVE DONE PRIOR TO YOUR ONE YEAR FOLLOW-UP APPOINTMENT WITH DR. Elease HashimotoNAHSER OR SAME DAY--WE WILL CHECK BMET, ALT, AND LIPIDS AT THAT TIME--PLEASE COME FASTING TO THIS LAB APPOINTMENT  If you have labs (blood work) drawn today and your tests are completely normal, you will receive your results only by: MyChart Message (if you have MyChart) OR A paper copy in the mail If you have any lab test that is abnormal or we need to change your treatment, we will call you to review the results.   Follow-Up: At [facility], you and your health needs are our priority.  As part of our continuing mission to provide you with exceptional heart care, we have created designated Provider Care Teams.  These Care Teams include your primary Cardiologist (physician) and Advanced Practice Providers (APPs -  Physician Assistants and Nurse Practitioners) who all work together to provide you with the care you need, when you need it.  We recommend signing up for the patient portal called "MyChart".  Sign up information is provided on this After Visit Summary.  MyChart is used to connect with patients for  Virtual Visits (Telemedicine).  Patients are able to view lab/test results, encounter notes, upcoming appointments, etc.  Non-urgent messages can be sent to your provider as well.   To learn more about what you can do with MyChart, go to [email or web address].    Your next appointment:   1 year(s)  The format for your next appointment:   In Person  Provider:   Mertie Moores, MD { [place], Mertie Moores, MD  09/12/2021 6:21 PM    [place]

## 2021-09-12 ENCOUNTER — Other Ambulatory Visit: Payer: Self-pay

## 2021-09-12 ENCOUNTER — Ambulatory Visit: Payer: BC Managed Care – PPO | Admitting: Cardiovascular Disease

## 2021-09-12 ENCOUNTER — Encounter: Payer: Self-pay | Admitting: Cardiovascular Disease

## 2021-09-12 VITALS — BP 116/68 | HR 54 | Ht 67.0 in | Wt 152.0 lb

## 2021-09-12 DIAGNOSIS — R931 Abnormal findings on diagnostic imaging of heart and coronary circulation: Secondary | ICD-10-CM | POA: Diagnosis not present

## 2021-09-12 DIAGNOSIS — E782 Mixed hyperlipidemia: Secondary | ICD-10-CM

## 2021-09-12 DIAGNOSIS — I341 Nonrheumatic mitral (valve) prolapse: Secondary | ICD-10-CM | POA: Diagnosis not present

## 2021-09-12 NOTE — Patient Instructions (Signed)
Medication Instructions:   Your physician recommends that you continue on your current medications as directed. Please refer to the Current Medication list given to you today.  *If you need a refill on your cardiac medications before your next appointment, please call your pharmacy*   Lab Work:  IN ONE YEAR HERE IN THE OFFICE--YOU MAY HAVE DONE PRIOR TO YOUR ONE YEAR FOLLOW-UP APPOINTMENT WITH DR. Elease Hashimoto OR SAME DAY--WE WILL CHECK BMET, ALT, AND LIPIDS AT THAT TIME--PLEASE COME FASTING TO THIS LAB APPOINTMENT  If you have labs (blood work) drawn today and your tests are completely normal, you will receive your results only by: MyChart Message (if you have MyChart) OR A paper copy in the mail If you have any lab test that is abnormal or we need to change your treatment, we will call you to review the results.   Follow-Up: At [facility], you and your health needs are our priority.  As part of our continuing mission to provide you with exceptional heart care, we have created designated Provider Care Teams.  These Care Teams include your primary Cardiologist (physician) and Advanced Practice Providers (APPs -  Physician Assistants and Nurse Practitioners) who all work together to provide you with the care you need, when you need it.  We recommend signing up for the patient portal called "MyChart".  Sign up information is provided on this After Visit Summary.  MyChart is used to connect with patients for Virtual Visits (Telemedicine).  Patients are able to view lab/test results, encounter notes, upcoming appointments, etc.  Non-urgent messages can be sent to your provider as well.   To learn more about what you can do with MyChart, go to [email or web address].    Your next appointment:   1 year(s)  The format for your next appointment:   In Person  Provider:   Kristeen Miss, MD { WITH LABS DONE AT THAT TIME

## 2021-11-21 DIAGNOSIS — L821 Other seborrheic keratosis: Secondary | ICD-10-CM | POA: Diagnosis not present

## 2021-11-21 DIAGNOSIS — L738 Other specified follicular disorders: Secondary | ICD-10-CM | POA: Diagnosis not present

## 2021-11-21 DIAGNOSIS — I788 Other diseases of capillaries: Secondary | ICD-10-CM | POA: Diagnosis not present

## 2021-11-21 DIAGNOSIS — D1801 Hemangioma of skin and subcutaneous tissue: Secondary | ICD-10-CM | POA: Diagnosis not present

## 2021-12-04 DIAGNOSIS — H401131 Primary open-angle glaucoma, bilateral, mild stage: Secondary | ICD-10-CM | POA: Diagnosis not present

## 2021-12-04 DIAGNOSIS — H3322 Serous retinal detachment, left eye: Secondary | ICD-10-CM | POA: Diagnosis not present

## 2022-01-25 DIAGNOSIS — M25561 Pain in right knee: Secondary | ICD-10-CM | POA: Diagnosis not present

## 2022-03-08 ENCOUNTER — Encounter (INDEPENDENT_AMBULATORY_CARE_PROVIDER_SITE_OTHER): Payer: 59 | Admitting: Ophthalmology

## 2022-03-18 ENCOUNTER — Encounter (INDEPENDENT_AMBULATORY_CARE_PROVIDER_SITE_OTHER): Payer: BC Managed Care – PPO | Admitting: Ophthalmology

## 2022-03-18 DIAGNOSIS — H35342 Macular cyst, hole, or pseudohole, left eye: Secondary | ICD-10-CM

## 2022-03-18 DIAGNOSIS — H43811 Vitreous degeneration, right eye: Secondary | ICD-10-CM | POA: Diagnosis not present

## 2022-03-18 DIAGNOSIS — H338 Other retinal detachments: Secondary | ICD-10-CM | POA: Diagnosis not present

## 2022-03-29 ENCOUNTER — Other Ambulatory Visit: Payer: Self-pay | Admitting: Family Medicine

## 2022-03-29 DIAGNOSIS — Z1231 Encounter for screening mammogram for malignant neoplasm of breast: Secondary | ICD-10-CM

## 2022-04-17 IMAGING — MR MR HEAD W/O CM
12 of 13 series · 44 of 48 positions shown · non-contrast
Comparison: None.

CLINICAL DATA: Neuro deficit, acute stroke suspected. Patient
complains of chest pain and paresthesias of left upper extremity

EXAM:
MRI HEAD WITHOUT CONTRAST
TECHNIQUE: Multiplanar, multiecho pulse sequences of the brain and surrounding
structures were obtained without intravenous contrast.

[Series 5: DWI · axial · 3.0mm · 0.88mm/px · z∈[-92,+47]mm · 9 of 96 slices shown (1 of 4)]
[im 1/96]
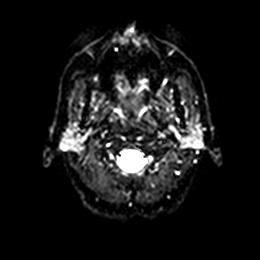
[im 12/96]
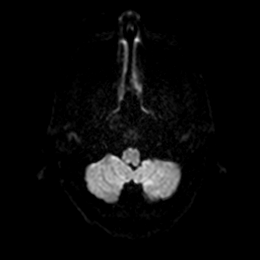
[im 24/96]
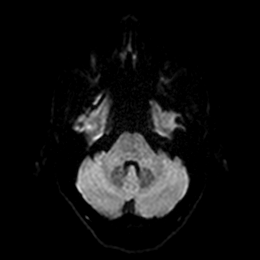
[im 36/96]
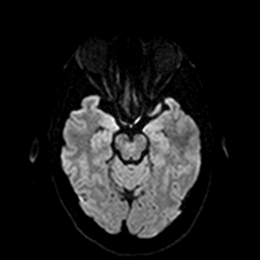
[im 48/96]
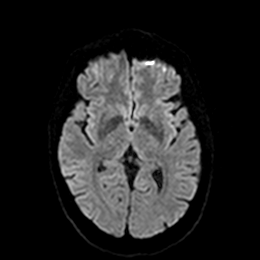
[im 60/96]
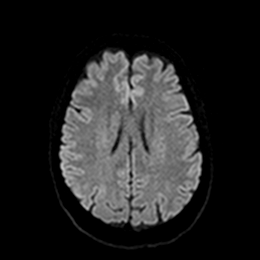
[im 72/96]
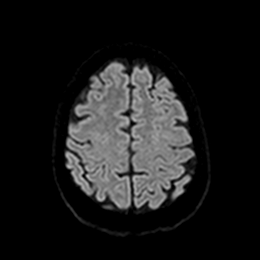
[im 84/96]
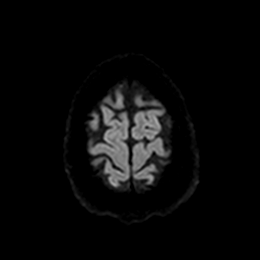
[im 96/96]
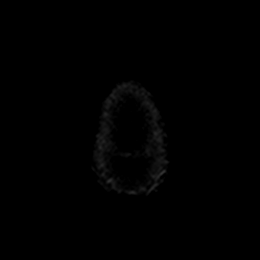

[Series 6: DWI · axial · 3.0mm · 0.88mm/px · z∈[-92,+47]mm · 4 of 48 slices shown (2 of 4)]
[im 1/48]
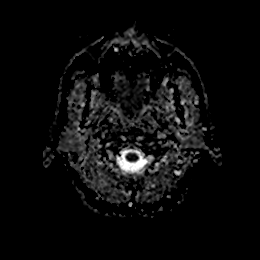
[im 16/48]
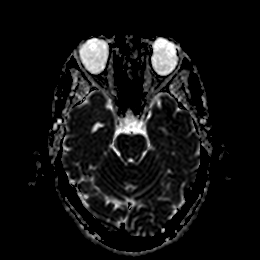
[im 32/48]
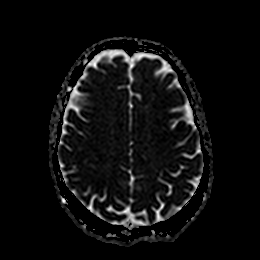
[im 48/48]
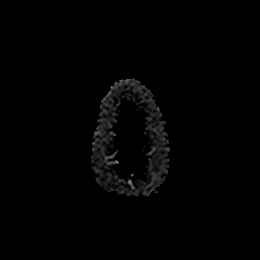

[Series 7: DWI · coronal · 4.0mm · 0.88mm/px · 5 of 64 slices shown (3 of 4)]
[im 1/64]
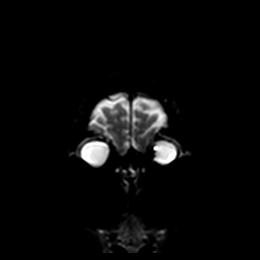
[im 16/64]
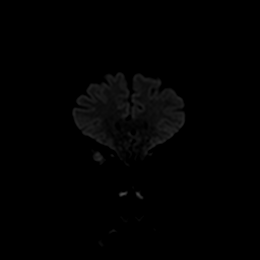
[im 32/64]
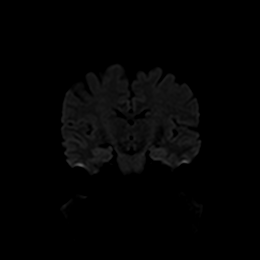
[im 48/64]
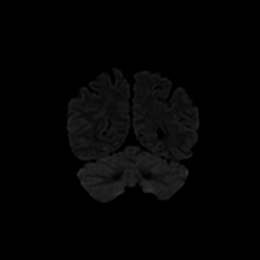
[im 64/64]
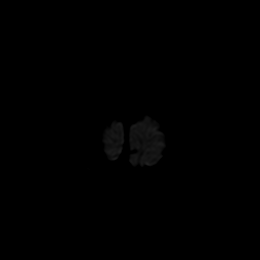

[Series 8: DWI · coronal · 4.0mm · 0.88mm/px · 3 of 32 slices shown (4 of 4)]
[im 1/32]
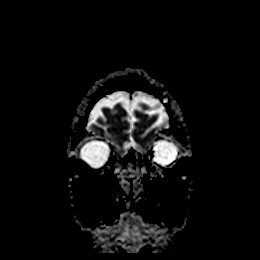
[im 16/32]
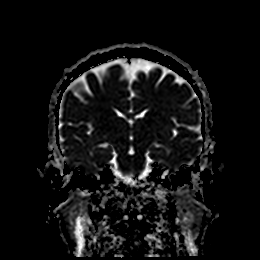
[im 32/32]
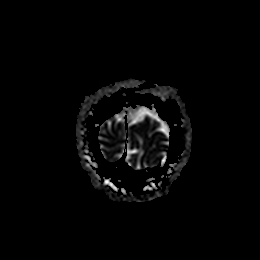

[Series 9: T1 · sagittal · 5.0mm · 0.75mm/px · 2 of 23 slices shown]
[im 1/23]
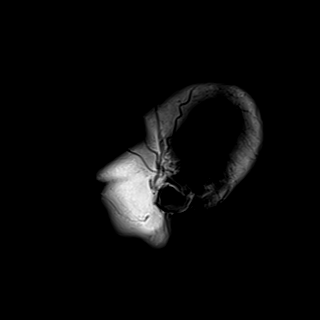
[im 23/23]
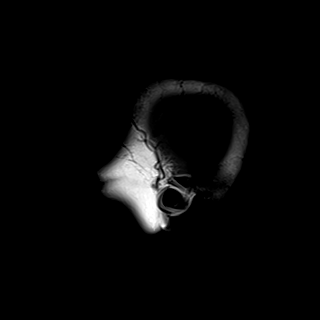

[Series 10: T2 · axial · 5.0mm · 0.72mm/px · z∈[-92,+51]mm · 2 of 25 slices shown (1 of 2)]
[im 1/25]
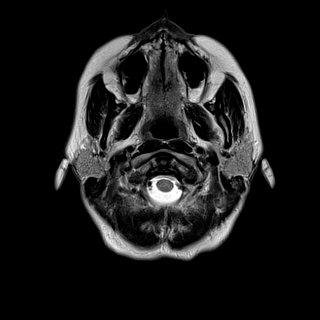
[im 25/25]
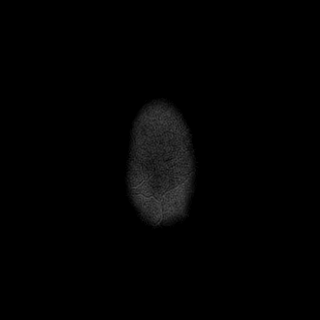

[Series 11: FLAIR · axial · 5.0mm · 0.45mm/px · z∈[-92,+51]mm · 2 of 25 slices shown]
[im 1/25]
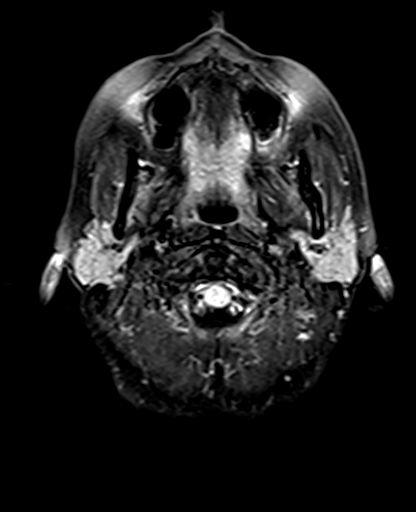
[im 25/25]
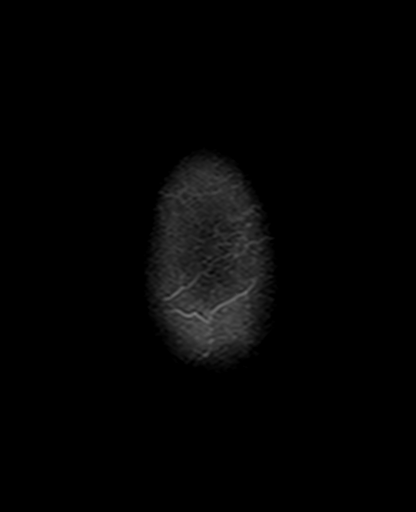

[Series 12: mag_images · axial · 3.0mm · 0.90mm/px · z∈[-93,+47]mm · 4 of 48 slices shown]
[im 1/48]
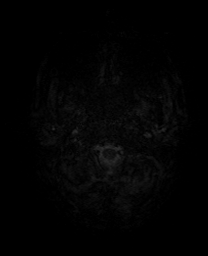
[im 16/48]
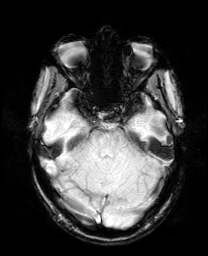
[im 32/48]
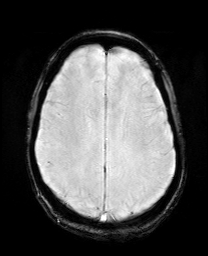
[im 48/48]
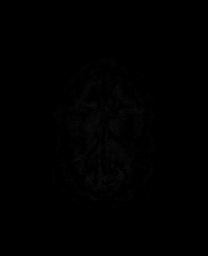

[Series 13: pha_images · axial · 3.0mm · 0.90mm/px · z∈[-93,+47]mm · 4 of 48 slices shown]
[im 1/48]
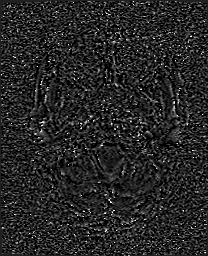
[im 16/48]
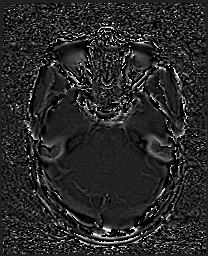
[im 32/48]
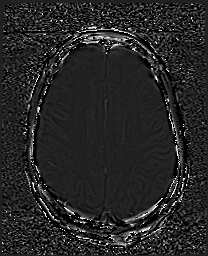
[im 48/48]
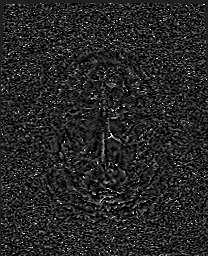

[Series 14: swi_images · axial · 3.0mm · 0.90mm/px · z∈[-93,+47]mm · 4 of 48 slices shown]
[im 1/48]
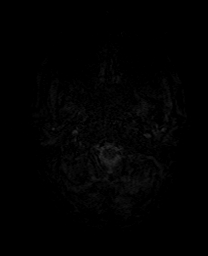
[im 16/48]
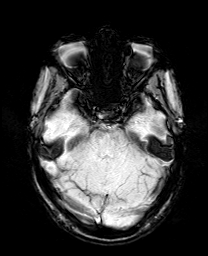
[im 32/48]
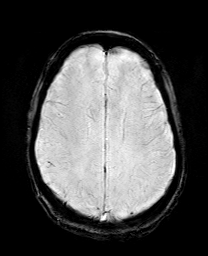
[im 48/48]
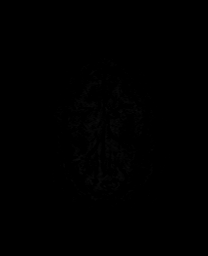

[Series 15: mip_images(sw) · axial · 24.0mm · 0.90mm/px · z∈[-82,+36]mm · 3 of 41 slices shown]
[im 1/41]
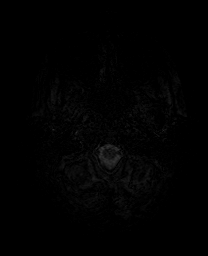
[im 21/41]
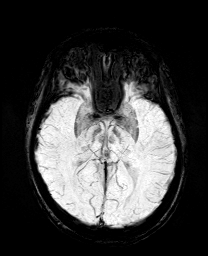
[im 41/41]
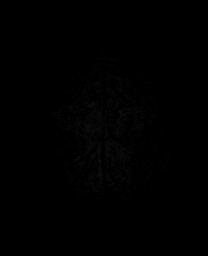

[Series 17: T2 · coronal · 5.0mm · 0.34mm/px · 2 of 29 slices shown (2 of 2)]
[im 1/29]
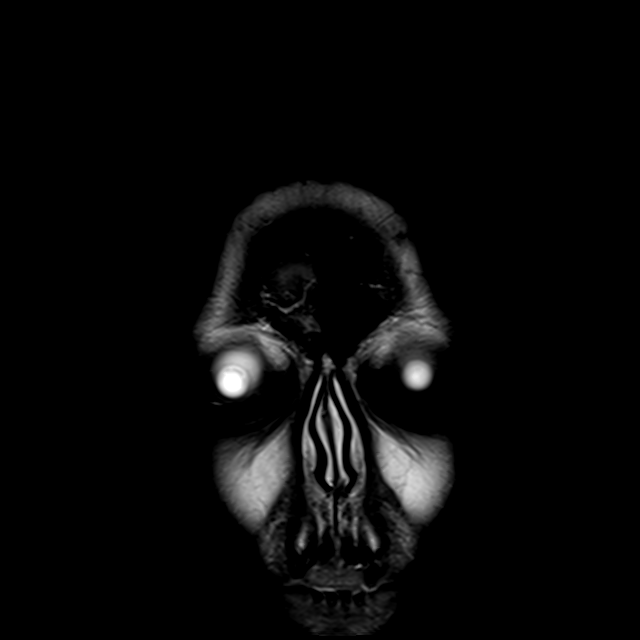
[im 29/29]
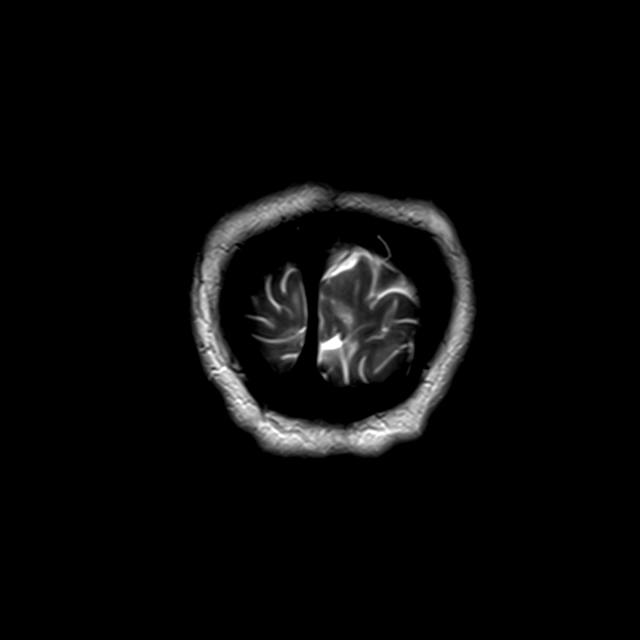

[44 of 48 positions shown; findings below may reference images not displayed]

FINDINGS: Brain: There is no acute intracranial hemorrhage, extra-axial fluid
collection, or infarct. There is no mass lesion. The ventricles are
not enlarged. There is no midline shift.

Vascular: Normal flow voids.

Skull and upper cervical spine: Normal marrow signal.

Sinuses/Orbits: Bilateral lens implants and a left scleral buckle
are noted. Slight deformity of the right lobe posterior medially
likely reflects a staphyloma. The paranasal sinuses are clear.

Other: None.
IMPRESSION: No acute intracranial pathology.

## 2022-05-02 ENCOUNTER — Ambulatory Visit
Admission: RE | Admit: 2022-05-02 | Discharge: 2022-05-02 | Disposition: A | Discharge status: Home / Self Care | Payer: BC Managed Care – PPO | Source: Ambulatory Visit | Attending: Family Medicine | Admitting: Family Medicine

## 2022-05-02 DIAGNOSIS — Z1231 Encounter for screening mammogram for malignant neoplasm of breast: Secondary | ICD-10-CM | POA: Diagnosis not present

## 2022-07-11 DIAGNOSIS — Z23 Encounter for immunization: Secondary | ICD-10-CM | POA: Diagnosis not present

## 2022-07-19 DIAGNOSIS — S30861A Insect bite (nonvenomous) of abdominal wall, initial encounter: Secondary | ICD-10-CM | POA: Diagnosis not present

## 2022-07-19 DIAGNOSIS — S80862A Insect bite (nonvenomous), left lower leg, initial encounter: Secondary | ICD-10-CM | POA: Diagnosis not present

## 2022-07-19 DIAGNOSIS — S80861A Insect bite (nonvenomous), right lower leg, initial encounter: Secondary | ICD-10-CM | POA: Diagnosis not present

## 2022-07-19 DIAGNOSIS — S40861A Insect bite (nonvenomous) of right upper arm, initial encounter: Secondary | ICD-10-CM | POA: Diagnosis not present

## 2022-08-08 DIAGNOSIS — I2510 Atherosclerotic heart disease of native coronary artery without angina pectoris: Secondary | ICD-10-CM | POA: Diagnosis not present

## 2022-08-08 DIAGNOSIS — Z79899 Other long term (current) drug therapy: Secondary | ICD-10-CM | POA: Diagnosis not present

## 2022-08-08 DIAGNOSIS — E785 Hyperlipidemia, unspecified: Secondary | ICD-10-CM | POA: Diagnosis not present

## 2022-08-08 DIAGNOSIS — Z0000 Encounter for general adult medical examination without abnormal findings: Secondary | ICD-10-CM | POA: Diagnosis not present

## 2022-08-24 ENCOUNTER — Encounter: Payer: Self-pay | Admitting: Cardiovascular Disease

## 2022-08-26 MED ORDER — ATORVASTATIN CALCIUM 20 MG PO TABS: 20.0000 mg | ORAL_TABLET | Freq: Every day | ORAL | 3 refills | Status: DC

## 2022-09-17 NOTE — Progress Notes (Unsigned)
Office Visit    Patient Name: Michelle Zamora Date of Encounter: 09/17/2022  Primary Care Provider:  [name], MD Primary Cardiologist:  Michelle Miss, MD Primary Electrophysiologist: None  Chief Complaint    Michelle Zamora is a 56 y.o. female with PMH of nonobstructive CAD, HLD, MVP who presents today for 1 year follow-up.  Past Medical History    Past Medical History:  Diagnosis Date   Complication of anesthesia    Glaucoma    Macular hole    MVP (mitral valve prolapse)    was monitored by Michelle Zamora- no need for follow up.   PONV (postoperative nausea and vomiting)    Past Surgical History:  Procedure Laterality Date   25 GAUGE PARS PLANA VITRECTOMY WITH 20 GAUGE MVR PORT FOR MACULAR HOLE Left 11/24/2012   Procedure: 25 GAUGE PARS PLANA VITRECTOMY WITH 20 GAUGE MVR PORT FOR MACULAR HOLE;  Surgeon: Michelle George, MD;  Location: [facility] OR;  Service: Ophthalmology;  Laterality: Left;   EYE SURGERY Left 2005   FINGER SURGERY Left 2003   repair Left ring finger.   GAS INSERTION Left 11/24/2012   Procedure: INSERTION OF GAS;  Surgeon: Michelle George, MD;  Location: [facility] OR;  Service: Ophthalmology;  Laterality: Left;  C3F8   MEMBRANE PEEL Left 11/24/2012   Procedure: MEMBRANE PEEL;  Surgeon: Michelle George, MD;  Location: [facility] OR;  Service: Ophthalmology;  Laterality: Left;   PHOTOCOAGULATION WITH LASER Left 11/24/2012   Procedure: PHOTOCOAGULATION WITH LASER;  Surgeon: Michelle George, MD;  Location[facility] OR;  Service: Ophthalmology;  Laterality: Left;  ENDOLASER   SERUM PATCH Left 11/24/2012   Procedure: SERUM PATCH;  Surgeon: Michelle George, MD;  Location: [facility] OR;  Service: Ophthalmology;  Laterality: Left;   VARICOSE VEIN SURGERY Bilateral 2006   VITERECTOMY  11/24/2012   Michelle Zamora    Allergies  Allergies  Allergen Reactions   Rosuvastatin     Nausea and headaches    History of Present Illness    Michelle Zamora  is a 56 year old female with the  above mention past medical history who presents today for 1 year follow-up of hyperlipidemia.  Michelle Zamora was initially seen by Michelle Zamora in January 2022 for management of hyperlipidemia.  She has family history of CAD on her mother side and had reported soreness between shoulder blades while exercising.  She reports no radiation of pain and she went to the ED where evaluation was negative for ACS.  She underwent exercise Myoview on 04/2021 that was normal with no evidence of ischemia.  She had coronary calcium score complete that showed score of 42.9 and had mild ostial calcifications in LAD.  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  She presents today for annual follow-up.  Since last being seen in the office patient reports***.  Patient denies chest pain, palpitations, dyspnea, PND, orthopnea, nausea, vomiting, dizziness, syncope, edema, weight gain, or early satiety.  ***Notes:  Home Medications    Current Outpatient Medications  Medication Sig Dispense Refill   atorvastatin (LIPITOR) 20 MG tablet Take 1 tablet (20 mg total) by mouth daily. 90 tablet 3   Calcium Carbonate-Vitamin D (CALCIUM 600 + D PO) Take 1 tablet by mouth daily.     cetirizine (ZYRTEC) 10 MG tablet Take 1 tablet by mouth daily as needed for allergies.  escitalopram (LEXAPRO) 10 MG tablet Take 10 mg by mouth daily.     fexofenadine (ALLEGRA) 180 MG tablet Take 1 tablet by mouth daily.     latanoprost (XALATAN) 0.005 % ophthalmic solution Place 1 drop into both eyes at bedtime.     Multiple Vitamin (MULTIVITAMIN WITH MINERALS) TABS Take 1 tablet by mouth daily.     timolol (TIMOPTIC) 0.5 % ophthalmic solution Place 1 drop into both eyes 2 (two) times daily.     No current facility-administered medications for this visit.     Review of Systems  Please see the history of present illness.    (+)*** (+)***  All other systems  reviewed and are otherwise negative except as noted above.  Physical Exam    Wt Readings from Last 3 Encounters:  09/12/21 152 lb (68.9 kg)  06/22/21 151 lb 9.6 oz (68.8 kg)  05/10/21 155 lb (70.3 kg)   ZC:HYIFO were no vitals filed for this visit.,There is no height or weight on file to calculate BMI.  Constitutional:      Appearance: Healthy appearance. Not in distress.  Neck:     Vascular: JVD normal.  Pulmonary:     Effort: Pulmonary effort is normal.     Breath sounds: No wheezing. No rales. Diminished in the bases Cardiovascular:     Normal rate. Regular rhythm. Normal S1. Normal S2.      Murmurs: There is no murmur.  Edema:    Peripheral edema absent.  Abdominal:     Palpations: Abdomen is soft non tender. There is no hepatomegaly.  Skin:    General: Skin is warm and dry.  Neurological:     General: No focal deficit present.     Mental Status: Alert and oriented to person, place and time.     Cranial Nerves: Cranial nerves are intact.  EKG/LABS/Other Studies Reviewed    ECG personally reviewed by me today - ***  Risk Assessment/Calculations:   {Does this patient have ATRIAL FIBRILLATION?:[phone]}        Lab Results  Component Value Date   WBC 6.7 05/03/2021   HGB 15.6 (H) 05/03/2021   HCT 45.5 05/03/2021   MCV 96.6 05/03/2021   PLT 196 05/03/2021   Lab Results  Component Value Date   CREATININE 0.95 05/03/2021   BUN 18 05/03/2021   NA 137 05/03/2021   K 4.4 05/03/2021   CL 102 05/03/2021   CO2 30 05/03/2021   Lab Results  Component Value Date   ALT 15 01/01/2021   AST 15 01/01/2021   ALKPHOS 54 01/01/2021   BILITOT 0.7 01/01/2021   Lab Results  Component Value Date   CHOL 133 01/01/2021   HDL 61 01/01/2021   LDLCALC 59 01/01/2021   TRIG 60 01/01/2021   CHOLHDL 2.2 01/01/2021    No results found for: "HGBA1C"  Assessment & Plan    1.  Nonobstructive CAD: -Coronary CT completed 06/2021 with calcium score of 42.9 with mild  calcifications in the LAD.  She also had Lexiscan Myoview completed that was normal. -Today she reports*** -Continue current GDMT with atorvastatin 20 mg daily  2.  Hyperlipidemia: -Patient's last LDL cholesterol was*** -Continue rosuvastatin 20 mg daily  3.  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  4.  Precordial chest pain -Today patient reports      Disposition: Follow-up with Michelle Miss, MD or APP  in *** months {Are you ordering a CV Procedure (e.g. stress test, cath, DCCV, TEE, etc)?   Press F2        :[id number]}   Medication Adjustments/Labs and Tests Ordered: Current medicines are reviewed at length with the patient today.  Concerns regarding medicines are outlined above.   Signed, Napoleon Form, Leodis Rains, NP 09/17/2022, 1:58 PM [facility]  Note:  This document was prepared using Dragon voice recognition software and may include unintentional dictation errors.

## 2022-09-18 ENCOUNTER — Encounter: Payer: Self-pay | Admitting: Nurse Practitioner

## 2022-09-18 ENCOUNTER — Ambulatory Visit: Payer: BC Managed Care – PPO | Admitting: Cardiovascular Disease

## 2022-09-18 ENCOUNTER — Ambulatory Visit: Payer: BC Managed Care – PPO | Attending: Cardiovascular Disease | Admitting: Nurse Practitioner

## 2022-09-18 VITALS — BP 118/70 | HR 52 | Ht 67.0 in | Wt 152.0 lb

## 2022-09-18 DIAGNOSIS — I2510 Atherosclerotic heart disease of native coronary artery without angina pectoris: Secondary | ICD-10-CM

## 2022-09-18 DIAGNOSIS — I341 Nonrheumatic mitral (valve) prolapse: Secondary | ICD-10-CM

## 2022-09-18 DIAGNOSIS — E782 Mixed hyperlipidemia: Secondary | ICD-10-CM | POA: Diagnosis not present

## 2022-09-18 DIAGNOSIS — R072 Precordial pain: Secondary | ICD-10-CM

## 2022-09-18 NOTE — Patient Instructions (Signed)
Medication Instructions:  Your physician recommends that you continue on your current medications as directed. Please refer to the Current Medication list given to you today.  *If you need a refill on your cardiac medications before your next appointment, please call your pharmacy*  Follow-Up: At [facility], you and your health needs are our priority.  As part of our continuing mission to provide you with exceptional heart care, we have created designated Provider Care Teams.  These Care Teams include your primary Cardiologist (physician) and Advanced Practice Providers (APPs -  Physician Assistants and Nurse Practitioners) who all work together to provide you with the care you need, when you need it.  Your next appointment:   1 year(s)  The format for your next appointment:   In Person  Provider:   Philip Nahser, MD     Important Information About Sugar       

## 2022-11-18 MED ORDER — ATORVASTATIN CALCIUM 20 MG PO TABS: 20.0000 mg | ORAL_TABLET | Freq: Every day | ORAL | 3 refills | Status: DC

## 2023-03-28 ENCOUNTER — Encounter (INDEPENDENT_AMBULATORY_CARE_PROVIDER_SITE_OTHER): Payer: BC Managed Care – PPO | Admitting: Ophthalmology

## 2023-03-28 DIAGNOSIS — H35371 Puckering of macula, right eye: Secondary | ICD-10-CM

## 2023-03-28 DIAGNOSIS — H338 Other retinal detachments: Secondary | ICD-10-CM

## 2023-03-28 DIAGNOSIS — H43813 Vitreous degeneration, bilateral: Secondary | ICD-10-CM | POA: Diagnosis not present

## 2023-03-28 DIAGNOSIS — H35342 Macular cyst, hole, or pseudohole, left eye: Secondary | ICD-10-CM

## 2023-05-06 ENCOUNTER — Encounter (HOSPITAL_BASED_OUTPATIENT_CLINIC_OR_DEPARTMENT_OTHER): Payer: BC Managed Care – PPO | Admitting: Radiology

## 2023-05-06 DIAGNOSIS — Z1231 Encounter for screening mammogram for malignant neoplasm of breast: Secondary | ICD-10-CM

## 2023-05-10 ENCOUNTER — Ambulatory Visit (HOSPITAL_BASED_OUTPATIENT_CLINIC_OR_DEPARTMENT_OTHER)
Admission: RE | Admit: 2023-05-10 | Discharge: 2023-05-10 | Disposition: A | Discharge status: Home / Self Care | Payer: BC Managed Care – PPO | Source: Ambulatory Visit | Attending: Family Medicine | Admitting: Family Medicine

## 2023-05-10 ENCOUNTER — Other Ambulatory Visit (HOSPITAL_BASED_OUTPATIENT_CLINIC_OR_DEPARTMENT_OTHER): Payer: Self-pay

## 2023-05-10 DIAGNOSIS — Z1231 Encounter for screening mammogram for malignant neoplasm of breast: Secondary | ICD-10-CM | POA: Diagnosis present

## 2023-08-18 LAB — LAB REPORT - SCANNED: EGFR: 79

## 2023-09-06 ENCOUNTER — Encounter: Payer: Self-pay | Admitting: Cardiovascular Disease

## 2023-09-06 NOTE — Progress Notes (Unsigned)
Cardiology Office Note:    Date:  09/08/2023   ID:  Michelle Zamora, DOB 03/07/1966, MRN [id number]  PCP:  [name], MD  [facility] Cardiologist:  Kristeen Miss, MD  [facility] Electrophysiologist:  None   Referring MD: [name], MD   Chief Complaint  Patient presents with   Chest Pain   Hyperlipidemia      Jan. 4, 2022   Michelle Zamora is a 57 y.o. female with a hx of hyperlipidemia.  We were asked to see her today for further management of her hyperliidemia by Dr. Lenise Arena.  I know Michelle Zamora from [facility] spin class .  She has continued to ride at home   Labs from her primary medical doctor reveal a total cholesterol of 222.  Her HDL is 72.  The triglyceride level is 66.  The LDL is 138.  I saw Michelle Zamora years ago ,  She has hx of MVP Mother has hx of CAD , CABG , died at age 52 Father was healthy,  Died in 01-23-2019 of COVID   She still rides several times a week  No CP when she rides .   April 12, 2021: Michelle Zamora is seen today for follow-up of her hyperlipidemia.  She has a family history of coronary artery disease with early cardiac death.  She also has a history of mitral valve prolapse.   Coronary calcium score of 52.5. This was 92nd percentile for age and sex matched control. She is on rosuvastatin 10 mg a day  Exercising regularly , no CP with exercise   Last Friday,  had 5 hard / fast heart beats . While at rest, in evening  Has felt a soreness. Sore front and back,  between shoulder blades  Has continued to exercise without any difficulty Chest and back are Still a little sore  No cough No covid symptoms Able to take a deep breath   That episode ended and did not cause any trouble but she has had a persistent chest soreness both on her chest and back since that time.  The pain is not related to eating, drinking, change of position, taking a deep breath.  There is no rib tenderness.  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   Sept.  23, 2022:  Michelle Zamora is seen for follow up of her chest pain and HLD  Chest tightness , radiation to the left arm   CP Is related to exercise  She went to the emergency room for further evaluation. Troponin levels were negative.  MRI of the brain was negative for TIA.   EKG was unremarkable.  No evidence of pericarditis.  Cycles , strength training    Dec. 14, 2022: Michelle Zamora is seen today for follow up of her chest pain , HLD, MVP Echo from Oct. 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  Mild MR.  Coronary artery calcium score 42.9 Agatston units. This places the patient in the 89th percentile, LAD - mild  ostial calcified plaque ( < 50% stenosis)  LCx - no CAD  RCA - no CAD   Lipids from 01/22/2021 showed LDL = 59 We tried crestor,  she had muscle aches, nausea, head aches .On Atorvastatin 20 mg a day -  Had labs in Oct with her primary md Research officer, political party)  Given her family hx of CAd, I would like her LDL to be around 50   No recent  CP  Was diagnosed with anxiety,  was started on Lexapro Seems to be much better    Dec. 9, 2024 Michelle Zamora is seen for follow up of her MVP, chest pain , HLD  She is on Atorvastatin 20 mg a day  She had labs drawn her at her primary medical doctor's office.  Her last LDL was 79.  Her total cholesterol is 156.  Triglyceride level 65.  HDL is 65.  Still active at the [facility] No CP , no dyspnea    Past Medical History:  Diagnosis Date   Complication of anesthesia    Glaucoma    Macular hole    MVP (mitral valve prolapse)    was monitored by Dr Carney Harder- no need for follow up.   PONV (postoperative nausea and vomiting)     Past Surgical History:  Procedure Laterality Date   25 GAUGE PARS PLANA VITRECTOMY WITH 20 GAUGE MVR PORT FOR MACULAR HOLE Left 11/24/2012   Procedure: 25 GAUGE PARS PLANA VITRECTOMY WITH 20 GAUGE MVR PORT FOR MACULAR HOLE;  Surgeon: Sherrie George, MD;  Location: [facility] OR;  Service: Ophthalmology;   Laterality: Left;   EYE SURGERY Left 2005   FINGER SURGERY Left 2003   repair Left ring finger.   GAS INSERTION Left 11/24/2012   Procedure: INSERTION OF GAS;  Surgeon: Sherrie George, MD;  [facility];  Service: Ophthalmology;  Laterality: Left;  C3F8   MEMBRANE PEEL Left 11/24/2012   Procedure: MEMBRANE PEEL;  Surgeon: Sherrie George, MD;  Location: [facility] OR;  Service: Ophthalmology;  Laterality: Left;   PHOTOCOAGULATION WITH LASER Left 11/24/2012   Procedure: PHOTOCOAGULATION WITH LASER;  Surgeon: Sherrie George, MD;  Location: [facility] OR;  Service: Ophthalmology;  Laterality: Left;  ENDOLASER   SERUM PATCH Left 11/24/2012   Procedure: SERUM PATCH;  Surgeon: Sherrie George, MD;  Location: [facility] OR;  Service: Ophthalmology;  Laterality: Left;   VARICOSE VEIN SURGERY Bilateral 2006   VITERECTOMY  11/24/2012   DR MATTHEWS    Current Medications: Current Meds  Medication Sig   atorvastatin (LIPITOR) 40 MG tablet Take 1 tablet (40 mg total) by mouth daily.   Calcium Carbonate-Vitamin D (CALCIUM 600 + D PO) Take 1 tablet by mouth daily.   escitalopram (LEXAPRO) 10 MG tablet Take 10 mg by mouth daily.   fexofenadine (ALLEGRA) 180 MG tablet Take 1 tablet by mouth daily.   latanoprost (XALATAN) 0.005 % ophthalmic solution Place 1 drop into both eyes at bedtime.   Multiple Vitamin (MULTIVITAMIN WITH MINERALS) TABS Take 1 tablet by mouth daily.   timolol (TIMOPTIC) 0.5 % ophthalmic solution Place 1 drop into both eyes 2 (two) times daily.   [DISCONTINUED] atorvastatin (LIPITOR) 20 MG tablet Take 1 tablet (20 mg total) by mouth daily.     Allergies:   Rosuvastatin   Social History   Socioeconomic History   Marital status: Married    Spouse name: Not on file   Number of children: Not on file   Years of education: Not on file   Highest education level: Not on file  Occupational History   Not on file  Tobacco Use   Smoking status: Never   Smokeless tobacco: Never  Substance and Sexual  Activity   Alcohol use: No   Drug use: No   Sexual activity: Not on file  Other Topics Concern   Not on file  Social History Narrative   Not on file   Social Determinants of  Health   Financial Resource Strain: Not on file  Food Insecurity: Not on file  Transportation Needs: Not on file  Physical Activity: Not on file  Stress: Not on file  Social Connections: Not on file     Family History: The patient's family history includes Breast cancer (age of onset: 32) in her sister; CAD in her mother.  ROS:   Please see the history of present illness.     All other systems reviewed and are negative.  EKGs/Labs/Other Studies Reviewed:        EKG:           Recent Labs: No results found for requested labs within last 365 days.  Recent Lipid Panel    Component Value Date/Time   CHOL 133 01/01/2021 0732   TRIG 60 01/01/2021 0732   HDL 61 01/01/2021 0732   CHOLHDL 2.2 01/01/2021 0732   LDLCALC 59 01/01/2021 0732     Risk Assessment/Calculations:       Physical Exam:     Physical Exam: Blood pressure 106/80, pulse (!) 55, height 5\' 7"  (1.702 m), weight 157 lb 3.2 oz (71.3 kg), last menstrual period 01/29/2018, SpO2 98%.     GEN:  Well nourished, well developed in no acute distress HEENT: Normal NECK: No JVD; No carotid bruits LYMPHATICS: No lymphadenopathy CARDIAC: RRR , very soft systolic murmur at L ax line .Marland Kitchen RESPIRATORY:  Clear to auscultation without rales, wheezing or rhonchi  ABDOMEN: Soft, non-tender, non-distended MUSCULOSKELETAL:  No edema; No deformity  SKIN: Warm and dry NEUROLOGIC:  Alert and oriented x 3   ASSESSMENT:    1. Mixed hyperlipidemia       PLAN:      1.  Chest discomfort: .   Has only mild CAD . Her goal LDL is < 70    Hyperlipidemia:   Her last LDL is 79.  I would like for her LDL to be lower than 70.  Will increase her atorvastatin to 40 mg a day. Will check a lipoprotein a. If her LP(a) is elevated she may benefit  from being on a PCSK9 inhibitor.  Will see if she tolerates the higher dose of atorvastatin.    2.  Mitral valve prolapse:  Very soft systolic murmur  Stable           Medication Adjustments/Labs and Tests Ordered: Current medicines are reviewed at length with the patient today.  Concerns regarding medicines are outlined above.  Orders Placed This Encounter  Procedures   Lipoprotein A (LPA)   EKG 12-Lead     Meds ordered this encounter  Medications   atorvastatin (LIPITOR) 40 MG tablet    Sig: Take 1 tablet (40 mg total) by mouth daily.    Dispense:  90 tablet    Refill:  3      Patient Instructions  Medication Instructions:  Please increase your Atorvastatin to 40 mg a day. Continue all other medications as listed.  *If you need a refill on your cardiac medications before your next appointment, please call your pharmacy*   Lab Work: Please have blood work today at [facility]  If you have labs (blood work) drawn today and your tests are completely normal, you will receive your results only by: MyChart Message (if you have MyChart) OR A paper copy in the mail If you have any lab test that is abnormal or we need to change your treatment, we will call you to review the results.   Follow-Up: At  [facility], you and your health needs are our priority.  As part of our continuing mission to provide you with exceptional heart care, we have created designated Provider Care Teams.  These Care Teams include your primary Cardiologist (physician) and Advanced Practice Providers (APPs -  Physician Assistants and Nurse Practitioners) who all work together to provide you with the care you need, when you need it.  We recommend signing up for the patient portal called "MyChart".  Sign up information is provided on this After Visit Summary.  MyChart is used to connect with patients for Virtual Visits (Telemedicine).  Patients are able to view lab/test results, encounter  notes, upcoming appointments, etc.  Non-urgent messages can be sent to your provider as well.   To learn more about what you can do with MyChart, go to [email or web address].    Your next appointment:   3 month(s)  Provider:   Kristeen Miss, MD        Signed, Kristeen Miss, MD  09/08/2023 6:05 PM    [facility]

## 2023-09-08 ENCOUNTER — Ambulatory Visit: Payer: BC Managed Care – PPO | Attending: Cardiovascular Disease | Admitting: Cardiovascular Disease

## 2023-09-08 ENCOUNTER — Encounter: Payer: Self-pay | Admitting: Cardiovascular Disease

## 2023-09-08 VITALS — BP 106/80 | HR 55 | Ht 67.0 in | Wt 157.2 lb

## 2023-09-08 DIAGNOSIS — E782 Mixed hyperlipidemia: Secondary | ICD-10-CM

## 2023-09-08 DIAGNOSIS — R072 Precordial pain: Secondary | ICD-10-CM

## 2023-09-08 MED ORDER — ATORVASTATIN CALCIUM 40 MG PO TABS: 40.0000 mg | ORAL_TABLET | Freq: Every day | ORAL | 3 refills | Status: DC

## 2023-09-08 NOTE — Patient Instructions (Signed)
Medication Instructions:  Please increase your Atorvastatin to 40 mg a day. Continue all other medications as listed.  *If you need a refill on your cardiac medications before your next appointment, please call your pharmacy*   Lab Work: Please have blood work today at [facility]  If you have labs (blood work) drawn today and your tests are completely normal, you will receive your results only by: MyChart Message (if you have MyChart) OR A paper copy in the mail If you have any lab test that is abnormal or we need to change your treatment, we will call you to review the results.   Follow-Up: At [facility], you and your health needs are our priority.  As part of our continuing mission to provide you with exceptional heart care, we have created designated Provider Care Teams.  These Care Teams include your primary Cardiologist (physician) and Advanced Practice Providers (APPs -  Physician Assistants and Nurse Practitioners) who all work together to provide you with the care you need, when you need it.  We recommend signing up for the patient portal called "MyChart".  Sign up information is provided on this After Visit Summary.  MyChart is used to connect with patients for Virtual Visits (Telemedicine).  Patients are able to view lab/test results, encounter notes, upcoming appointments, etc.  Non-urgent messages can be sent to your provider as well.   To learn more about what you can do with MyChart, go to [email or web address].    Your next appointment:   3 month(s)  Provider:   Kristeen Miss, MD

## 2023-09-10 LAB — LIPOPROTEIN A (LPA): Lipoprotein (a): 162.2 nmol/L — ABNORMAL HIGH (ref <75.0)

## 2023-12-08 ENCOUNTER — Encounter: Payer: Self-pay | Admitting: Cardiovascular Disease

## 2023-12-08 NOTE — Progress Notes (Unsigned)
 Cardiology Office Note:    Date:  12/09/2023   ID:  Michelle Zamora, DOB [birth year], MRN [id number]  PCP:  [name], MD  [facility] Cardiologist:  Kristeen Miss, MD  [facility] Electrophysiologist:  None   Referring MD: [name], MD   Chief Complaint  Patient presents with   Hyperlipidemia      Jan. 4, 2022   Michelle Zamora is a 58 y.o. female with a hx of hyperlipidemia.  We were asked to see her today for further management of her hyperliidemia by Dr. Lenise Arena.  I know Michelle Zamora from [facility] spin class .  She has continued to ride at home   Labs from her primary medical doctor reveal a total cholesterol of 222.  Her HDL is 72.  The triglyceride level is 66.  The LDL is 138.  I saw Michelle Zamora years ago ,  She has hx of MVP Mother has hx of CAD , CABG , died at age 107 Father was healthy,  Died in 01-21-19 of COVID   She still rides several times a week  No CP when she rides .   April 12, 2021: Michelle Zamora is seen today for follow-up of her hyperlipidemia.  She has a family history of coronary artery disease with early cardiac death.  She also has a history of mitral valve prolapse.   Coronary calcium score of 52.5. This was 92nd percentile for age and sex matched control. She is on rosuvastatin 10 mg a day  Exercising regularly , no CP with exercise   Last Friday,  had 5 hard / fast heart beats . While at rest, in evening  Has felt a soreness. Sore front and back,  between shoulder blades  Has continued to exercise without any difficulty Chest and back are Still a little sore  No cough No covid symptoms Able to take a deep breath   That episode ended and did not cause any trouble but she has had a persistent chest soreness both on her chest and back since that time.  The pain is not related to eating, drinking, change of position, taking a deep breath.  There is no rib tenderness.  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 23,  2022:  Michelle Zamora is seen for follow up of her chest pain and HLD  Chest tightness , radiation to the left arm   CP Is related to exercise  She went to the emergency room for further evaluation. Troponin levels were negative.  MRI of the brain was negative for TIA.   EKG was unremarkable.  No evidence of pericarditis.  Cycles , strength training    Dec. 14, 2022: Michelle Zamora is seen today for follow up of her chest pain , HLD, MVP Echo from Oct. 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  Mild MR.  Coronary artery calcium score 42.9 Agatston units. This places the patient in the 89th percentile, LAD - mild  ostial calcified plaque ( < 50% stenosis)  LCx - no CAD  RCA - no CAD   Lipids from 2021-01-20 showed LDL = 59 We tried crestor,  she had muscle aches, nausea, head aches .On Atorvastatin 20 mg a day -  Had labs in Oct with her primary md Research officer, political party)  Given her family hx of CAd, I would like her LDL to be around 50   No recent CP  Was diagnosed  with anxiety,  was started on Lexapro Seems to be much better    Dec. 9, 2024 Michelle Zamora is seen for follow up of her MVP, chest pain , HLD  She is on Atorvastatin 20 mg a day  She had labs drawn her at her primary medical doctor's office.  Her last LDL was 79.  Her total cholesterol is 156.  Triglyceride level 65.  HDL is 65.  Still active at the [facility] No CP , no dyspnea    December 09, 2023 Michelle Zamora is seen for follow up of her MVP and hyperlipidemia  LP(a) is 162 Coronary CTA in Oct 2022 shows a CAC score of 42.9.   89th percentile for age / sex) Mild CAD ( calcified ostial LAD plaque, nonobstructive )   We increased her atorvastatin at her last visit  Check lipid , ALT today   Still teaching spin class at the [employer]  No angina .     Works out very hard   Will check labs today . I would like for her to see our pharmacy lipid clinic  She has a elevated LP(a) and I would like to have her on a PCSK9  inhibitor if it would help lower her risk   BP and HR are well controlled      Past Medical History:  Diagnosis Date   Complication of anesthesia    Glaucoma    Macular hole    MVP (mitral valve prolapse)    was monitored by Dr Carney Harder- no need for follow up.   PONV (postoperative nausea and vomiting)     Past Surgical History:  Procedure Laterality Date   25 GAUGE PARS PLANA VITRECTOMY WITH 20 GAUGE MVR PORT FOR MACULAR HOLE Left 11/24/2012   Procedure: 25 GAUGE PARS PLANA VITRECTOMY WITH 20 GAUGE MVR PORT FOR MACULAR HOLE;  Surgeon: Sherrie George, MD;  Location: [facility] OR;  Service: Ophthalmology;  Laterality: Left;   EYE SURGERY Left 2005   FINGER SURGERY Left 2003   repair Left ring finger.   GAS INSERTION Left 11/24/2012   Procedure: INSERTION OF GAS;  Surgeon: Sherrie George, MD;  Location: [facility] OR;  Service: Ophthalmology;  Laterality: Left;  C3F8   MEMBRANE PEEL Left 11/24/2012   Procedure: MEMBRANE PEEL;  Surgeon: Sherrie George, MD;  Location: [facility] OR;  Service: Ophthalmology;  Laterality: Left;   PHOTOCOAGULATION WITH LASER Left 11/24/2012   Procedure: PHOTOCOAGULATION WITH LASER;  Surgeon: Sherrie George, MD;  Location[facility] OR;  Service: Ophthalmology;  Laterality: Left;  ENDOLASER   SERUM PATCH Left 11/24/2012   Procedure: SERUM PATCH;  Surgeon: Sherrie George, MD;  Location: [facility] OR;  Service: Ophthalmology;  Laterality: Left;   VARICOSE VEIN SURGERY Bilateral 2006   VITERECTOMY  11/24/2012   DR MATTHEWS    Current Medications: Current Meds  Medication Sig   atorvastatin (LIPITOR) 40 MG tablet Take 1 tablet (40 mg total) by mouth daily.   Calcium Carbonate-Vitamin D (CALCIUM 600 + D PO) Take 1 tablet by mouth daily.   escitalopram (LEXAPRO) 10 MG tablet Take 10 mg by mouth daily.   fexofenadine (ALLEGRA) 180 MG tablet Take 1 tablet by mouth daily.   latanoprost (XALATAN) 0.005 % ophthalmic solution Place 1 drop into both eyes at bedtime.   Multiple Vitamin  (MULTIVITAMIN WITH MINERALS) TABS Take 1 tablet by mouth daily.   timolol (TIMOPTIC) 0.5 % ophthalmic solution Place 1 drop into both eyes 2 (two) times daily.  Allergies:   Rosuvastatin   Social History   Socioeconomic History   Marital status: Married    Spouse name: Not on file   Number of children: Not on file   Years of education: Not on file   Highest education level: Not on file  Occupational History   Not on file  Tobacco Use   Smoking status: Never   Smokeless tobacco: Never  Substance and Sexual Activity   Alcohol use: No   Drug use: No   Sexual activity: Not on file  Other Topics Concern   Not on file  Social History Narrative   Not on file   Social Drivers of Health   Financial Resource Strain: Not on file  Food Insecurity: Not on file  Transportation Needs: Not on file  Physical Activity: Not on file  Stress: Not on file  Social Connections: Not on file     Family History: The patient's family history includes Breast cancer (age of onset: 34) in her sister; CAD in her mother.  ROS:   Please see the history of present illness.     All other systems reviewed and are negative.  EKGs/Labs/Other Studies Reviewed:        EKG:           Recent Labs: No results found for requested labs within last 365 days.  Recent Lipid Panel    Component Value Date/Time   CHOL 133 01/01/2021 0732   TRIG 60 01/01/2021 0732   HDL 61 01/01/2021 0732   CHOLHDL 2.2 01/01/2021 0732   LDLCALC 59 01/01/2021 0732     Risk Assessment/Calculations:       Physical Exam:     Physical Exam: Blood pressure 118/74, pulse (!) 47, height 5\' 7"  (1.702 m), weight 156 lb (70.8 kg), last menstrual period 01/29/2018, SpO2 92%.       GEN:  Well nourished, well developed in no acute distress HEENT: Normal NECK: No JVD; No carotid bruits LYMPHATICS: No lymphadenopathy CARDIAC: RRR , very soft / faint systolic murmur radiating toward left ax line  RESPIRATORY:  Clear  to auscultation without rales, wheezing or rhonchi  ABDOMEN: Soft, non-tender, non-distended MUSCULOSKELETAL:  No edema; No deformity  SKIN: Warm and dry NEUROLOGIC:  Alert and oriented x 3    ASSESSMENT:    1. Mixed hyperlipidemia   2. Nonobstructive atherosclerosis of coronary artery   3. Elevated lipoprotein A level        PLAN:      1.  Chest discomfort: .     No further episodes of CP .  2.  Hyperlipidemia:    we increased her atorvastatin to 40 mg at her last visit .  Check Will refer her to the lipid clinic for an opinion     2.  Mitral valve prolapse:   Has a very soft systolic murmur  Has mild prolapse of the anterior leaflet and has mild MR by echo .  I doubt she will ever need MV surgery        Medication Adjustments/Labs and Tests Ordered: Current medicines are reviewed at length with the patient today.  Concerns regarding medicines are outlined above.  Orders Placed This Encounter  Procedures   Lipid panel   ALT   Basic metabolic panel   AMB Referral to [facility]     No orders of the defined types were placed in this encounter.     Patient Instructions  Lab Work: Lipids, ALT, BMET today If  you have labs (blood work) drawn today and your tests are completely normal, you will receive your results only by: MyChart Message (if you have MyChart) OR A paper copy in the mail If you have any lab test that is abnormal or we need to change your treatment, we will call you to review the results.   Testing/Procedures: Ambulatory referral to lipid clinic   Follow-Up: At [facility], you and your health needs are our priority.  As part of our continuing mission to provide you with exceptional heart care, we have created designated Provider Care Teams.  These Care Teams include your primary Cardiologist (physician) and Advanced Practice Providers (APPs -  Physician Assistants and Nurse Practitioners) who all work together to provide  you with the care you need, when you need it.  Your next appointment:   1 year(s)  Provider:   Tessa Lerner, MD   1st Floor: - Lobby - Registration  - Pharmacy  - Lab - Cafe  2nd Floor: - PV Lab - Diagnostic Testing (echo, CT, nuclear med)  3rd Floor: - Vacant  4th Floor: - TCTS (cardiothoracic surgery) - AFib Clinic - Structural Heart Clinic - Vascular Surgery  - Vascular Ultrasound  5th Floor: - HeartCare Cardiology (general and EP) - Clinical Pharmacy for coumadin, hypertension, lipid, weight-loss medications, and med management appointments    Valet parking services will be available as well.     Signed, Kristeen Miss, MD  12/09/2023 8:59 AM    [facility]

## 2023-12-09 ENCOUNTER — Ambulatory Visit: Payer: BC Managed Care – PPO | Attending: Cardiovascular Disease | Admitting: Cardiovascular Disease

## 2023-12-09 ENCOUNTER — Encounter: Payer: Self-pay | Admitting: Cardiovascular Disease

## 2023-12-09 VITALS — BP 118/74 | HR 47 | Ht 67.0 in | Wt 156.0 lb

## 2023-12-09 DIAGNOSIS — I2510 Atherosclerotic heart disease of native coronary artery without angina pectoris: Secondary | ICD-10-CM

## 2023-12-09 DIAGNOSIS — E782 Mixed hyperlipidemia: Secondary | ICD-10-CM

## 2023-12-09 DIAGNOSIS — E7841 Elevated Lipoprotein(a): Secondary | ICD-10-CM | POA: Diagnosis not present

## 2023-12-09 LAB — BASIC METABOLIC PANEL WITH GFR
BUN/Creatinine Ratio: 18 (ref 9–23)
BUN: 17 mg/dL (ref 6–24)
CO2: 24 mmol/L (ref 20–29)
Calcium: 9.4 mg/dL (ref 8.7–10.2)
Chloride: 102 mmol/L (ref 96–106)
Creatinine, Ser: 0.96 mg/dL (ref 0.57–1.00)
Glucose: 88 mg/dL (ref 70–99)
Potassium: 4.1 mmol/L (ref 3.5–5.2)
Sodium: 142 mmol/L (ref 134–144)
eGFR: 69 mL/min/1.73

## 2023-12-09 LAB — LIPID PANEL
Chol/HDL Ratio: 2.2 ratio (ref 0.0–4.4)
Cholesterol, Total: 147 mg/dL (ref 100–199)
HDL: 67 mg/dL (ref >39)
LDL Chol Calc (NIH): 68 mg/dL (ref 0–99)
Triglycerides: 57 mg/dL (ref 0–149)
VLDL Cholesterol Cal: 12 mg/dL (ref 5–40)

## 2023-12-09 LAB — ALT: ALT: 20 IU/L (ref 0–32)

## 2023-12-09 NOTE — Patient Instructions (Signed)
 Lab Work: Lipids, ALT, BMET today If you have labs (blood work) drawn today and your tests are completely normal, you will receive your results only by: MyChart Message (if you have MyChart) OR A paper copy in the mail If you have any lab test that is abnormal or we need to change your treatment, we will call you to review the results.   Testing/Procedures: Ambulatory referral to lipid clinic   Follow-Up: At [facility], you and your health needs are our priority.  As part of our continuing mission to provide you with exceptional heart care, we have created designated Provider Care Teams.  These Care Teams include your primary Cardiologist (physician) and Advanced Practice Providers (APPs -  Physician Assistants and Nurse Practitioners) who all work together to provide you with the care you need, when you need it.  Your next appointment:   1 year(s)  Provider:   Tessa Lerner, MD   1st Floor: - Lobby - Registration  - Pharmacy  - Lab - Cafe  2nd Floor: - PV Lab - Diagnostic Testing (echo, CT, nuclear med)  3rd Floor: - Vacant  4th Floor: - TCTS (cardiothoracic surgery) - AFib Clinic - Structural Heart Clinic - Vascular Surgery  - Vascular Ultrasound  5th Floor: - [facility] (general and EP) - Clinical Pharmacy for coumadin, hypertension, lipid, weight-loss medications, and med management appointments    Valet parking services will be available as well.

## 2023-12-10 ENCOUNTER — Encounter: Payer: Self-pay | Admitting: Cardiovascular Disease

## 2023-12-10 ENCOUNTER — Encounter: Payer: Self-pay | Admitting: Pharmacist

## 2024-02-02 ENCOUNTER — Other Ambulatory Visit (HOSPITAL_COMMUNITY): Payer: Self-pay

## 2024-02-02 ENCOUNTER — Ambulatory Visit: Attending: Cardiology | Admitting: Pharmacist

## 2024-02-02 ENCOUNTER — Telehealth: Payer: Self-pay | Admitting: Pharmacy Technician

## 2024-02-02 DIAGNOSIS — R931 Abnormal findings on diagnostic imaging of heart and coronary circulation: Secondary | ICD-10-CM

## 2024-02-02 DIAGNOSIS — E782 Mixed hyperlipidemia: Secondary | ICD-10-CM | POA: Diagnosis not present

## 2024-02-02 NOTE — Progress Notes (Signed)
 Patient ID: Michelle Zamora                 DOB: 07-25-1966                    MRN: [id number]      HPI: Michelle Zamora is a 58 y.o. female patient referred to lipid clinic by Dr. Alroy Aspen. PMH is significant for HLD, coronary calcium  score of 52.5 in 2022. This was 92nd percentile for age and sex matched control, MVP, chest pain, mild MR.  CTA in Oct 2023  Coronary artery calcium  score 42.9 Agatston units. This places the patient in the 89th percentile. Calcified plaque ostial LAD with mild (<50%) stenosis.   Seen by lipid clinic in Oct 2022 and started on atorvastatin  due to intolerance of rosuvastatin  10mg . LPa is 162.  Patient presents today lipid clinic.  Tolerating atorvastatin  40 mg daily.  Due to elevated LP(a) and premature CAD plus a strong family history of premature CAD desires to be more aggressive with her cholesterol.  Mother had her first MI at 25.  Died of a heart attack in her 8s.  Overall her lifestyle is very healthy.  Exercises on a regular basis.  Reviewed PCSK9.  Discussed mechanisms of action, dosing, side effects and potential decreases in LDL cholesterol.  Also reviewed cost information and potential options for patient assistance with co-pay card.   Current Medications: atorvastatin  40mg  daily Intolerances: rosuvastatin  (muscle aches, nausea, head aches) Risk Factors: family hx of premature CAD, CAC 42.9 (89th percentile), elevated LP(a) 162 LDL-C goal: <55 ApoB goal: Less than 70  Diet:  Breakfast: yogurt fruit (low fat), cottage cheese, banana w/ PB Lunch: left overs, carnitas and beans Dinner: [place] burgers, beans and corn bread, 2 meatless meals per week Does like sweets Drink: water, milk (skim), hot chocolate  Exercise: spin class, weight training twice a week  Family History: Mother has hx of CAD , CABG , died at age 87 Father was healthy,  Died in 02/02/2019 of COVID  Social History: rare ETOH, no tobacco  Labs: Lipid Panel      Component Value Date/Time   CHOL 147 12/09/2023 0917   TRIG 57 12/09/2023 0917   HDL 67 12/09/2023 0917   CHOLHDL 2.2 12/09/2023 0917   LDLCALC 68 12/09/2023 0917   LABVLDL 12 12/09/2023 0917    Past Medical History:  Diagnosis Date   Complication of anesthesia    Glaucoma    Macular hole    MVP (mitral valve prolapse)    was monitored by Dr Tere Felts- no need for follow up.   PONV (postoperative nausea and vomiting)     Current Outpatient Medications on File Prior to Visit  Medication Sig Dispense Refill   atorvastatin  (LIPITOR) 40 MG tablet Take 1 tablet (40 mg total) by mouth daily. 90 tablet 3   Calcium  Carbonate-Vitamin D (CALCIUM  600 + D PO) Take 1 tablet by mouth daily.     escitalopram (LEXAPRO) 10 MG tablet Take 10 mg by mouth daily.     fexofenadine (ALLEGRA) 180 MG tablet Take 1 tablet by mouth daily.     latanoprost  (XALATAN ) 0.005 % ophthalmic solution Place 1 drop into both eyes at bedtime.     Multiple Vitamin (MULTIVITAMIN WITH MINERALS) TABS Take 1 tablet by mouth daily.     timolol (TIMOPTIC) 0.5 % ophthalmic solution Place 1 drop into both eyes 2 (two) times daily.     No current facility-administered  medications on file prior to visit.    Allergies  Allergen Reactions   Rosuvastatin      Nausea and headaches    Assessment/Plan:  1. Hyperlipidemia -  Hyperlipidemia Assessment: LDL-C is above goal of less than 55 due to premature CAD, strong family history, elevated LP(a) Her LP(a) at this current time would not qualify her for any clinical trial We discussed PCSK9 ability to lower LP(a) by about 20%.  Ezetimibe will lower around 8% We discussed the importance of being aggressive with LDL-C reduction We will also track ApoB on next labs Takes spin classes regularly and also does weight training twice a week Diet low in fat, appears higher in fiber Patient does have a high deductible plan.  Aware that co-pay card has $2500 on it per year.  Patient  appears okay with paying once co-pay card maxes out.  Plan: Submit prior authorization for Repatha Lipid panel, ApoB, LP(a) after 6 injections Directions on how to get a co-pay card were given    Thank you,  Lacresia Darwish D Tavin Vernet, Pharm.D, BCACP, CPP [facility] [address] [place], [place] [address]  Phone: [phone]; Fax: [phone]

## 2024-02-02 NOTE — Telephone Encounter (Signed)
 Pharmacy Patient Advocate Encounter  Received notification from [facility] that Prior Authorization for Repatha has been APPROVED from 02/02/24 to 02/01/25. Ran test claim, Copay is $24.99- one month. This test claim was processed through [facility]- copay amounts may vary at other pharmacies due to pharmacy/plan contracts, or as the patient moves through the different stages of their insurance plan.   PA #/Case ID/Reference #: [id number]

## 2024-02-02 NOTE — Telephone Encounter (Signed)
 Pharmacy Patient Advocate Encounter   Received notification from Pt Calls Messages that prior authorization for REPATHA is required/requested.   Insurance verification completed.   The patient is insured through [facility] .   Per test claim: PA required; PA submitted to above mentioned insurance via CoverMyMeds Key/confirmation #/EOC [facility] Status is pending

## 2024-02-02 NOTE — Patient Instructions (Signed)
 I will submit a prior authorization for Repatha. I will call you once I hear back. Please call me at [phone] with any questions.   Repatha is a cholesterol medication that improved your body's ability to get rid of "bad cholesterol" known as LDL. It can lower your LDL up to 60%! It is an injection that is given under the skin every 2 weeks. The medication often requires a prior authorization from your insurance company. We will take care of submitting all the necessary information to your insurance company to get it approved. The most common side effects of Repatha include runny nose, symptoms of the common cold, rarely flu or flu-like symptoms, back/muscle pain in about 3-4% of the patients, and redness, pain, or bruising at the injection site. Tell your healthcare provider if you have any side effect that bothers you or that does not go away.   Continue atorvastatin  40mg  daily

## 2024-02-02 NOTE — Assessment & Plan Note (Signed)
 Assessment: LDL-C is above goal of less than 55 due to premature CAD, strong family history, elevated LP(a) Her LP(a) at this current time would not qualify her for any clinical trial We discussed PCSK9 ability to lower LP(a) by about 20%.  Ezetimibe will lower around 8% We discussed the importance of being aggressive with LDL-C reduction We will also track ApoB on next labs Takes spin classes regularly and also does weight training twice a week Diet low in fat, appears higher in fiber Patient does have a high deductible plan.  Aware that co-pay card has $2500 on it per year.  Patient appears okay with paying once co-pay card maxes out.  Plan: Submit prior authorization for Repatha Lipid panel, ApoB, LP(a) after 6 injections Directions on how to get a co-pay card were given

## 2024-02-05 MED ORDER — REPATHA SURECLICK 140 MG/ML ~~LOC~~ SOAJ: 1.0000 mL | SUBCUTANEOUS | 11 refills | Status: AC

## 2024-04-09 ENCOUNTER — Encounter (INDEPENDENT_AMBULATORY_CARE_PROVIDER_SITE_OTHER): Payer: BC Managed Care – PPO | Admitting: Ophthalmology

## 2024-04-09 DIAGNOSIS — H35371 Puckering of macula, right eye: Secondary | ICD-10-CM

## 2024-04-09 DIAGNOSIS — H35342 Macular cyst, hole, or pseudohole, left eye: Secondary | ICD-10-CM | POA: Diagnosis not present

## 2024-04-09 DIAGNOSIS — H43811 Vitreous degeneration, right eye: Secondary | ICD-10-CM | POA: Diagnosis not present

## 2024-04-09 DIAGNOSIS — H338 Other retinal detachments: Secondary | ICD-10-CM | POA: Diagnosis not present

## 2024-04-24 LAB — APOLIPOPROTEIN B: Apolipoprotein B: 39 mg/dL (ref <90)

## 2024-04-24 LAB — LIPID PANEL
Chol/HDL Ratio: 1.6 ratio (ref 0.0–4.4)
Cholesterol, Total: 106 mg/dL (ref 100–199)
HDL: 65 mg/dL (ref >39)
LDL Chol Calc (NIH): 29 mg/dL (ref 0–99)
Triglycerides: 46 mg/dL (ref 0–149)
VLDL Cholesterol Cal: 12 mg/dL (ref 5–40)

## 2024-04-24 LAB — LIPOPROTEIN A (LPA): Lipoprotein (a): 170 nmol/L — ABNORMAL HIGH (ref <75.0)

## 2024-04-26 ENCOUNTER — Ambulatory Visit: Payer: Self-pay | Admitting: Pharmacist

## 2024-05-07 ENCOUNTER — Other Ambulatory Visit (HOSPITAL_BASED_OUTPATIENT_CLINIC_OR_DEPARTMENT_OTHER): Payer: Self-pay | Admitting: Family Medicine

## 2024-05-07 DIAGNOSIS — Z1231 Encounter for screening mammogram for malignant neoplasm of breast: Secondary | ICD-10-CM

## 2024-05-11 ENCOUNTER — Encounter (HOSPITAL_BASED_OUTPATIENT_CLINIC_OR_DEPARTMENT_OTHER): Payer: Self-pay | Admitting: Radiology

## 2024-05-11 ENCOUNTER — Encounter (HOSPITAL_BASED_OUTPATIENT_CLINIC_OR_DEPARTMENT_OTHER): Admitting: Radiology

## 2024-05-11 ENCOUNTER — Ambulatory Visit (HOSPITAL_BASED_OUTPATIENT_CLINIC_OR_DEPARTMENT_OTHER)
Admission: RE | Admit: 2024-05-11 | Discharge: 2024-05-11 | Disposition: A | Discharge status: Home / Self Care | Source: Ambulatory Visit

## 2024-05-11 DIAGNOSIS — Z1231 Encounter for screening mammogram for malignant neoplasm of breast: Secondary | ICD-10-CM

## 2024-05-14 ENCOUNTER — Encounter (HOSPITAL_BASED_OUTPATIENT_CLINIC_OR_DEPARTMENT_OTHER): Admitting: Radiology

## 2024-05-14 DIAGNOSIS — Z1231 Encounter for screening mammogram for malignant neoplasm of breast: Secondary | ICD-10-CM

## 2024-09-20 ENCOUNTER — Other Ambulatory Visit: Payer: Self-pay

## 2024-09-21 MED ORDER — ATORVASTATIN CALCIUM 40 MG PO TABS: 40.0000 mg | ORAL_TABLET | Freq: Every day | ORAL | 0 refills | Status: AC

## 2024-12-07 ENCOUNTER — Ambulatory Visit: Admitting: Cardiology

## 2025-04-08 ENCOUNTER — Encounter (INDEPENDENT_AMBULATORY_CARE_PROVIDER_SITE_OTHER): Admitting: Ophthalmology
